# Patient Record
Sex: Male | Born: 1950 | Race: White | Hispanic: No | Marital: Married | Smoking: Never smoker
Health system: Southern US, Community
[De-identification: ages and names within clinical notes are randomized; demographics above are authoritative.]

## PROBLEM LIST (undated history)

## (undated) DIAGNOSIS — R7989 Other specified abnormal findings of blood chemistry: Secondary | ICD-10-CM

## (undated) DIAGNOSIS — E291 Testicular hypofunction: Secondary | ICD-10-CM

## (undated) DIAGNOSIS — Z0001 Encounter for general adult medical examination with abnormal findings: Secondary | ICD-10-CM

## (undated) DIAGNOSIS — H33021 Retinal detachment with multiple breaks, right eye: Secondary | ICD-10-CM

## (undated) DIAGNOSIS — I2510 Atherosclerotic heart disease of native coronary artery without angina pectoris: Secondary | ICD-10-CM

## (undated) HISTORY — PX: BACK SURGERY: SHX140

## (undated) HISTORY — PX: RETINAL DETACHMENT SURGERY: SHX105

---
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
## 2008-08-15 ENCOUNTER — Encounter: Admission: RE | Admit: 2008-08-15 | Discharge: 2008-08-15 | Payer: Self-pay | Admitting: Family Medicine

## 2015-06-11 DIAGNOSIS — I491 Atrial premature depolarization: Secondary | ICD-10-CM | POA: Diagnosis not present

## 2015-06-11 DIAGNOSIS — R079 Chest pain, unspecified: Secondary | ICD-10-CM | POA: Diagnosis not present

## 2015-06-11 DIAGNOSIS — Z6830 Body mass index (BMI) 30.0-30.9, adult: Secondary | ICD-10-CM | POA: Diagnosis not present

## 2015-06-11 DIAGNOSIS — R001 Bradycardia, unspecified: Secondary | ICD-10-CM | POA: Diagnosis not present

## 2015-06-15 DIAGNOSIS — R079 Chest pain, unspecified: Secondary | ICD-10-CM | POA: Diagnosis not present

## 2015-06-23 DIAGNOSIS — R079 Chest pain, unspecified: Secondary | ICD-10-CM | POA: Diagnosis not present

## 2015-06-24 DIAGNOSIS — R079 Chest pain, unspecified: Secondary | ICD-10-CM | POA: Diagnosis not present

## 2015-07-06 ENCOUNTER — Encounter: Payer: Self-pay | Admitting: Cardiology

## 2015-07-06 ENCOUNTER — Encounter: Payer: Self-pay | Admitting: *Deleted

## 2015-07-06 NOTE — Progress Notes (Deleted)
   PCP: No primary care provider on file.  Clinic Note: No chief complaint on file.   HPI: Jeffrey Poole is a 65 y.o. male with a PMH below who presents today for ***.  Jeffrey Poole was last seen on ***  Recent Hospitalizations: ***  Studies Reviewed: ***  Interval History: ***   No chest pain or shortness of breath with rest or exertion. No PND, orthopnea or edema. No palpitations, lightheadedness, dizziness, weakness or syncope/near syncope. No TIA/amaurosis fugax symptoms. No melena, hematochezia, hematuria, or epstaxis. No claudication.  ROS: A comprehensive was performed. ROS   No past medical history on file.  No past surgical history on file.  @hmed @  @all @  Social History   Social History  . Marital Status: Single    Spouse Name: N/A  . Number of Children: N/A  . Years of Education: N/A   Social History Main Topics  . Smoking status: Not on file  . Smokeless tobacco: Not on file  . Alcohol Use: Not on file  . Drug Use: Not on file  . Sexual Activity: Not on file   Other Topics Concern  . Not on file   Social History Narrative  . No narrative on file    @famh @  Wt Readings from Last 3 Encounters:  No data found for Wt    PHYSICAL EXAM There were no vitals taken for this visit. General appearance: alert, cooperative, appears stated age, no distress and *** obese Neck: no adenopathy, no carotid bruit and no JVD Lungs: clear to auscultation bilaterally, normal percussion bilaterally and non-labored Heart: regular rate and rhythm, S1, S2 normal, no murmur, click, rub or gallop *** Abdomen: soft, non-tender; bowel sounds normal; no masses,  no organomegaly; *** Extremities: extremities normal, atraumatic, no cyanosis, and edema *** Pulses: 2+ and symmetric; *** Skin: {normal findings:33173} or {abnormal findings:33192} Neurologic: Mental status: Alert, oriented, thought content appropriate Cranial nerves: normal (II-XII grossly intact)    Adult ECG Report  Rate: *** ;  Rhythm: {rhythm:17366};   Narrative Interpretation: ***   Other studies Reviewed: Additional studies/ records that were reviewed today include:  Recent Labs:  ***     ASSESSMENT / PLAN: Problem List Items Addressed This Visit    None      Current medicines are reviewed at length with the patient today. (+/- concerns) *** The following changes have been made: *** Studies Ordered:   No orders of the defined types were placed in this encounter.      Jeffrey Poole, M.D., M.S. Interventional Cardiologist   Pager # [phone] Phone # [phone] [address] [place], [place] [address]

## 2015-07-07 NOTE — Progress Notes (Signed)
This encounter was created in error - please disregard.

## 2015-07-16 DIAGNOSIS — H00025 Hordeolum internum left lower eyelid: Secondary | ICD-10-CM | POA: Diagnosis not present

## 2015-10-12 DIAGNOSIS — M722 Plantar fascial fibromatosis: Secondary | ICD-10-CM | POA: Diagnosis not present

## 2015-10-12 DIAGNOSIS — M6702 Short Achilles tendon (acquired), left ankle: Secondary | ICD-10-CM | POA: Diagnosis not present

## 2016-02-21 DIAGNOSIS — Z6829 Body mass index (BMI) 29.0-29.9, adult: Secondary | ICD-10-CM | POA: Diagnosis not present

## 2016-02-21 DIAGNOSIS — J329 Chronic sinusitis, unspecified: Secondary | ICD-10-CM | POA: Diagnosis not present

## 2016-02-21 DIAGNOSIS — I10 Essential (primary) hypertension: Secondary | ICD-10-CM | POA: Diagnosis not present

## 2016-06-08 DIAGNOSIS — M722 Plantar fascial fibromatosis: Secondary | ICD-10-CM | POA: Diagnosis not present

## 2016-06-08 DIAGNOSIS — M6702 Short Achilles tendon (acquired), left ankle: Secondary | ICD-10-CM | POA: Diagnosis not present

## 2016-06-08 DIAGNOSIS — I872 Venous insufficiency (chronic) (peripheral): Secondary | ICD-10-CM | POA: Diagnosis not present

## 2016-10-05 DIAGNOSIS — H179 Unspecified corneal scar and opacity: Secondary | ICD-10-CM | POA: Diagnosis not present

## 2016-10-05 DIAGNOSIS — H25812 Combined forms of age-related cataract, left eye: Secondary | ICD-10-CM | POA: Diagnosis not present

## 2016-10-05 DIAGNOSIS — H5212 Myopia, left eye: Secondary | ICD-10-CM | POA: Diagnosis not present

## 2016-10-05 DIAGNOSIS — Z961 Presence of intraocular lens: Secondary | ICD-10-CM | POA: Diagnosis not present

## 2016-10-05 DIAGNOSIS — Z9841 Cataract extraction status, right eye: Secondary | ICD-10-CM | POA: Diagnosis not present

## 2016-10-05 DIAGNOSIS — H52222 Regular astigmatism, left eye: Secondary | ICD-10-CM | POA: Diagnosis not present

## 2016-10-05 DIAGNOSIS — H524 Presbyopia: Secondary | ICD-10-CM | POA: Diagnosis not present

## 2016-12-04 DIAGNOSIS — Z23 Encounter for immunization: Secondary | ICD-10-CM | POA: Diagnosis not present

## 2017-05-03 ENCOUNTER — Ambulatory Visit: Payer: Self-pay | Admitting: Family Medicine

## 2017-05-07 ENCOUNTER — Ambulatory Visit (INDEPENDENT_AMBULATORY_CARE_PROVIDER_SITE_OTHER): Payer: Medicare Other | Admitting: Family Medicine

## 2017-05-07 ENCOUNTER — Encounter: Payer: Self-pay | Admitting: Family Medicine

## 2017-05-07 VITALS — BP 136/80 | HR 57 | Ht 75.0 in | Wt 249.1 lb

## 2017-05-07 DIAGNOSIS — Z1159 Encounter for screening for other viral diseases: Secondary | ICD-10-CM | POA: Diagnosis not present

## 2017-05-07 DIAGNOSIS — M65332 Trigger finger, left middle finger: Secondary | ICD-10-CM | POA: Diagnosis not present

## 2017-05-07 DIAGNOSIS — M65351 Trigger finger, right little finger: Secondary | ICD-10-CM | POA: Diagnosis not present

## 2017-05-07 DIAGNOSIS — R5383 Other fatigue: Secondary | ICD-10-CM | POA: Diagnosis not present

## 2017-05-07 DIAGNOSIS — M65312 Trigger thumb, left thumb: Secondary | ICD-10-CM | POA: Diagnosis not present

## 2017-05-07 DIAGNOSIS — M65311 Trigger thumb, right thumb: Secondary | ICD-10-CM

## 2017-05-07 DIAGNOSIS — M65322 Trigger finger, left index finger: Secondary | ICD-10-CM | POA: Diagnosis not present

## 2017-05-07 DIAGNOSIS — M65331 Trigger finger, right middle finger: Secondary | ICD-10-CM | POA: Diagnosis not present

## 2017-05-07 DIAGNOSIS — M65321 Trigger finger, right index finger: Secondary | ICD-10-CM | POA: Diagnosis not present

## 2017-05-07 DIAGNOSIS — M65342 Trigger finger, left ring finger: Secondary | ICD-10-CM | POA: Diagnosis not present

## 2017-05-07 DIAGNOSIS — M65352 Trigger finger, left little finger: Secondary | ICD-10-CM

## 2017-05-07 DIAGNOSIS — E291 Testicular hypofunction: Secondary | ICD-10-CM | POA: Insufficient documentation

## 2017-05-07 DIAGNOSIS — K219 Gastro-esophageal reflux disease without esophagitis: Secondary | ICD-10-CM

## 2017-05-07 DIAGNOSIS — M65341 Trigger finger, right ring finger: Secondary | ICD-10-CM | POA: Diagnosis not present

## 2017-05-07 DIAGNOSIS — Z0001 Encounter for general adult medical examination with abnormal findings: Secondary | ICD-10-CM | POA: Diagnosis not present

## 2017-05-07 DIAGNOSIS — Z0000 Encounter for general adult medical examination without abnormal findings: Secondary | ICD-10-CM | POA: Insufficient documentation

## 2017-05-07 HISTORY — DX: Encounter for general adult medical examination with abnormal findings: Z00.01

## 2017-05-07 HISTORY — DX: Gastro-esophageal reflux disease without esophagitis: K21.9

## 2017-05-07 HISTORY — DX: Testicular hypofunction: E29.1

## 2017-05-07 HISTORY — DX: Trigger thumb, left thumb: M65.311

## 2017-05-07 MED ORDER — LANSOPRAZOLE 30 MG PO CPDR: 30.0000 mg | DELAYED_RELEASE_CAPSULE | Freq: Every day | ORAL | 1 refills | Status: DC

## 2017-05-07 NOTE — Progress Notes (Signed)
Subjective:  Patient ID: Jeffrey Poole, male    DOB: 1950/02/06  Age: 67 y.o. MRN: [id number]  CC: Establish Care   HPI AQUAN KOPE presents for establishment of care for physical exam and has multiple issues to discuss.  He requests that his hepatitis C antigen be checked.  It is never been checked for him.  He describes waking up in the morning with fingers on either hand being stuck in a flexed position.  He actually has to run warm water water over them massage before he can manually straighten his fingers.  He did not do manual labor for work but did do some repetitive motion in his job.  His blood pressure runs from the 1 teens up to 150 over the 70-80 range.  His wife urges him to take 1 of her 3 blood pressure medicines but that she is concerned about his pressures.  He does not have a history of chronic kidney disease.  He has a past medical history of androgen deficiency treated with exogenous testosterone.  He does complain of fatigue and lack of energy.  He had a normal colonoscopy 2 years ago.  He has a long-standing history of GERD that has been successfully treated with a proton pump inhibitor in the past.  He does not smoke, drink alcohol or use illicit drugs.  He is not currently exercising on a regular basis.  He is retired and lives with his wife.  History Luisenrique has no past medical history on file.   He has no past surgical history on file.   His family history is not on file.He reports that he has never smoked. He has never used smokeless tobacco. His alcohol and drug histories are not on file.  No outpatient medications prior to visit.   No facility-administered medications prior to visit.     ROS Review of Systems  Constitutional: Positive for fatigue. Negative for chills, fever and unexpected weight change.  HENT: Negative.   Eyes: Negative for photophobia and visual disturbance.  Respiratory: Negative for chest tightness and shortness of breath.   Cardiovascular:  Negative for chest pain.  Gastrointestinal: Negative.   Endocrine: Negative for polyphagia and polyuria.  Genitourinary: Negative for difficulty urinating, frequency, hematuria and urgency.  Musculoskeletal: Positive for arthralgias.  Skin: Negative.   Allergic/Immunologic: Negative for immunocompromised state.  Neurological: Negative for weakness and headaches.  Hematological: Does not bruise/bleed easily.  Psychiatric/Behavioral: Negative.     Objective:  BP 136/80 (BP Location: Right Arm, Patient Position: Sitting, Cuff Size: Normal)   Pulse (!) 57   Ht 6\' 3"  (1.905 m)   Wt 249 lb 2 oz (113 kg)   SpO2 96%   BMI 31.14 kg/m   Physical Exam  Constitutional: He is oriented to person, place, and time. He appears well-developed and well-nourished. No distress.  HENT:  Head: Normocephalic and atraumatic.  Right Ear: External ear normal.  Left Ear: External ear normal.  Nose: Nose normal.  Mouth/Throat: Oropharynx is clear and moist. No oropharyngeal exudate.  Eyes: Pupils are equal, round, and reactive to light. Conjunctivae and EOM are normal. Right eye exhibits no discharge. Left eye exhibits no discharge. No scleral icterus.  Neck: Normal range of motion. Neck supple. No JVD present. No tracheal deviation present. No thyromegaly present.  Cardiovascular: Normal rate, regular rhythm and normal heart sounds.  Pulmonary/Chest: Effort normal and breath sounds normal.  Abdominal: Soft. Bowel sounds are normal. He exhibits no distension. There is no tenderness.  There is no guarding.  Genitourinary: Rectal exam shows no external hemorrhoid, no internal hemorrhoid, no fissure, no mass, no tenderness, anal tone normal and guaiac negative stool. Prostate is not enlarged and not tender.  Lymphadenopathy:    He has no cervical adenopathy.  Neurological: He is alert and oriented to person, place, and time.  Skin: Skin is warm and dry. No rash noted. He is not diaphoretic. No erythema. No  pallor.  Psychiatric: He has a normal mood and affect. His behavior is normal.      Assessment & Plan:   Bunny was seen today for establish care.  Diagnoses and all orders for this visit:  Trigger finger of all digits of both hands -     Ambulatory referral to Sports Medicine  Gastroesophageal reflux disease, esophagitis presence not specified -     lansoprazole (PREVACID) 30 MG capsule; Take 1 capsule (30 mg total) by mouth daily.  Encounter for hepatitis C screening test for low risk patient -     Hepatitis C antibody  Encounter for health maintenance examination with abnormal findings -     CBC -     Comprehensive metabolic panel -     Lipid panel -     PSA  Androgen deficiency -     Testosterone  Fatigue, unspecified type -     CBC -     Urinalysis, Routine w reflex microscopic -     TSH   I am having Taurus M. Sponaugle start on lansoprazole.  Meds ordered this encounter  Medications  . lansoprazole (PREVACID) 30 MG capsule    Sig: Take 1 capsule (30 mg total) by mouth daily.    Dispense:  100 capsule    Refill:  1   Explained that systolic pressure of less than 150 is considered normal for his age group without a history of chronic kidney disease.  Advised him to lose weight and lower the salt in his diet.  He was given information on the DASH diet.  He was encouraged to lose weight and become more physically active with nonweightbearing aerobic activity.  He  said that he had jogged in the past and I suggested that he use an elliptical machine instead.  He was given information on preventative health and health maintenance.  We will try Prevacid for his GERD symptoms.   Follow-up: Return in about 3 months (around 08/06/2017).  Libby Maw, MD

## 2017-05-07 NOTE — Patient Instructions (Signed)
Fatigue Fatigue is feeling tired all of the time, a lack of energy, or a lack of motivation. Occasional or mild fatigue is often a normal response to activity or life in general. However, long-lasting (chronic) or extreme fatigue may indicate an underlying medical condition. Follow these instructions at home: Watch your fatigue for any changes. The following actions may help to lessen any discomfort you are feeling:  Talk to your health care provider about how much sleep you need each night. Try to get the required amount every night.  Take medicines only as directed by your health care provider.  Eat a healthy and nutritious diet. Ask your health care provider if you need help changing your diet.  Drink enough fluid to keep your urine clear or pale yellow.  Practice ways of relaxing, such as yoga, meditation, massage therapy, or acupuncture.  Exercise regularly.  Change situations that cause you stress. Try to keep your work and personal routine reasonable.  Do not abuse illegal drugs.  Limit alcohol intake to no more than 1 drink per day for nonpregnant women and 2 drinks per day for men. One drink equals 12 ounces of beer, 5 ounces of wine, or 1 ounces of hard liquor.  Take a multivitamin, if directed by your health care provider.  Contact a health care provider if:  Your fatigue does not get better.  You have a fever.  You have unintentional weight loss or gain.  You have headaches.  You have difficulty: ? Falling asleep. ? Sleeping throughout the night.  You feel angry, guilty, anxious, or sad.  You are unable to have a bowel movement (constipation).  You skin is dry.  Your legs or another part of your body is swollen. Get help right away if:  You feel confused.  Your vision is blurry.  You feel faint or pass out.  You have a severe headache.  You have severe abdominal, pelvic, or back pain.  You have chest pain, shortness of breath, or an irregular or  fast heartbeat.  You are unable to urinate or you urinate less than normal.  You develop abnormal bleeding, such as bleeding from the rectum, vagina, nose, lungs, or nipples.  You vomit blood.  You have thoughts about harming yourself or committing suicide.  You are worried that you might harm someone else. This information is not intended to replace advice given to you by your health care provider. Make sure you discuss any questions you have with your health care provider. Document Released: 10/30/2006 Document Revised: 06/10/2015 Document Reviewed: 05/06/2013 Elsevier Interactive Patient Education  2018 [place].  Gastroesophageal Reflux Disease, Adult Normally, food travels down the esophagus and stays in the stomach to be digested. If a person has gastroesophageal reflux disease (GERD), food and stomach acid move back up into the esophagus. When this happens, the esophagus becomes sore and swollen (inflamed). Over time, GERD can make small holes (ulcers) in the lining of the esophagus. Follow these instructions at home: Diet  Follow a diet as told by your doctor. You may need to avoid foods and drinks such as: ? Coffee and tea (with or without caffeine). ? Drinks that contain alcohol. ? Energy drinks and sports drinks. ? Carbonated drinks or sodas. ? Chocolate and cocoa. ? Peppermint and mint flavorings. ? Garlic and onions. ? Horseradish. ? Spicy and acidic foods, such as peppers, chili powder, curry powder, vinegar, hot sauces, and BBQ sauce. ? Citrus fruit juices and citrus fruits, such as oranges,  lemons, and limes. ? Tomato-based foods, such as red sauce, chili, salsa, and pizza with red sauce. ? Fried and fatty foods, such as donuts, french fries, potato chips, and high-fat dressings. ? High-fat meats, such as hot dogs, rib eye steak, sausage, ham, and bacon. ? High-fat dairy items, such as whole milk, butter, and cream cheese.  Eat small meals often. Avoid eating  large meals.  Avoid drinking large amounts of liquid with your meals.  Avoid eating meals during the 2-3 hours before bedtime.  Avoid lying down right after you eat.  Do not exercise right after you eat. General instructions  Pay attention to any changes in your symptoms.  Take over-the-counter and prescription medicines only as told by your doctor. Do not take aspirin, ibuprofen, or other NSAIDs unless your doctor says it is okay.  Do not use any tobacco products, including cigarettes, chewing tobacco, and e-cigarettes. If you need help quitting, ask your doctor.  Wear loose clothes. Do not wear anything tight around your waist.  Raise (elevate) the head of your bed about 6 inches (15 cm).  Try to lower your stress. If you need help doing this, ask your doctor.  If you are overweight, lose an amount of weight that is healthy for you. Ask your doctor about a safe weight loss goal.  Keep all follow-up visits as told by your doctor. This is important. Contact a doctor if:  You have new symptoms.  You lose weight and you do not know why it is happening.  You have trouble swallowing, or it hurts to swallow.  You have wheezing or a cough that keeps happening.  Your symptoms do not get better with treatment.  You have a hoarse voice. Get help right away if:  You have pain in your arms, neck, jaw, teeth, or back.  You feel sweaty, dizzy, or light-headed.  You have chest pain or shortness of breath.  You throw up (vomit) and your throw up looks like blood or coffee grounds.  You pass out (faint).  Your poop (stool) is bloody or black.  You cannot swallow, drink, or eat. This information is not intended to replace advice given to you by your health care provider. Make sure you discuss any questions you have with your health care provider. Document Released: 06/21/2007 Document Revised: 06/10/2015 Document Reviewed: 04/29/2014 Elsevier Interactive Patient Education   2018 [place] Maintenance, Male A healthy lifestyle and preventive care is important for your health and wellness. Ask your health care provider about what schedule of regular examinations is right for you. What should I know about weight and diet? Eat a Healthy Diet  Eat plenty of vegetables, fruits, whole grains, low-fat dairy products, and lean protein.  Do not eat a lot of foods high in solid fats, added sugars, or salt.  Maintain a Healthy Weight Regular exercise can help you achieve or maintain a healthy weight. You should:  Do at least 150 minutes of exercise each week. The exercise should increase your heart rate and make you sweat (moderate-intensity exercise).  Do strength-training exercises at least twice a week.  Watch Your Levels of Cholesterol and Blood Lipids  Have your blood tested for lipids and cholesterol every 5 years starting at 67 years of age. If you are at high risk for heart disease, you should start having your blood tested when you are 67 years old. You may need to have your cholesterol levels checked more often if: ? Your lipid  or cholesterol levels are high. ? You are older than 67 years of age. ? You are at high risk for heart disease.  What should I know about cancer screening? Many types of cancers can be detected early and may often be prevented. Lung Cancer  You should be screened every year for lung cancer if: ? You are a current smoker who has smoked for at least 30 years. ? You are a former smoker who has quit within the past 15 years.  Talk to your health care provider about your screening options, when you should start screening, and how often you should be screened.  Colorectal Cancer  Routine colorectal cancer screening usually begins at 67 years of age and should be repeated every 5-10 years until you are 67 years old. You may need to be screened more often if early forms of precancerous polyps or small growths are found.  Your health care provider may recommend screening at an earlier age if you have risk factors for colon cancer.  Your health care provider may recommend using home test kits to check for hidden blood in the stool.  A small camera at the end of a tube can be used to examine your colon (sigmoidoscopy or colonoscopy). This checks for the earliest forms of colorectal cancer.  Prostate and Testicular Cancer  Depending on your age and overall health, your health care provider may do certain tests to screen for prostate and testicular cancer.  Talk to your health care provider about any symptoms or concerns you have about testicular or prostate cancer.  Skin Cancer  Check your skin from head to toe regularly.  Tell your health care provider about any new moles or changes in moles, especially if: ? There is a change in a mole's size, shape, or color. ? You have a mole that is larger than a pencil eraser.  Always use sunscreen. Apply sunscreen liberally and repeat throughout the day.  Protect yourself by wearing long sleeves, pants, a wide-brimmed hat, and sunglasses when outside.  What should I know about heart disease, diabetes, and high blood pressure?  If you are 49-73 years of age, have your blood pressure checked every 3-5 years. If you are 16 years of age or older, have your blood pressure checked every year. You should have your blood pressure measured twice-once when you are at a hospital or clinic, and once when you are not at a hospital or clinic. Record the average of the two measurements. To check your blood pressure when you are not at a hospital or clinic, you can use: ? An automated blood pressure machine at a pharmacy. ? A home blood pressure monitor.  Talk to your health care provider about your target blood pressure.  If you are between 43-71 years old, ask your health care provider if you should take aspirin to prevent heart disease.  Have regular diabetes screenings by  checking your fasting blood sugar level. ? If you are at a normal weight and have a low risk for diabetes, have this test once every three years after the age of 73. ? If you are overweight and have a high risk for diabetes, consider being tested at a younger age or more often.  A one-time screening for abdominal aortic aneurysm (AAA) by ultrasound is recommended for men aged 55-75 years who are current or former smokers. What should I know about preventing infection? Hepatitis B If you have a higher risk for hepatitis B, you should  be screened for this virus. Talk with your health care provider to find out if you are at risk for hepatitis B infection. Hepatitis C Blood testing is recommended for:  Everyone born from [birth year] through 1965.  Anyone with known risk factors for hepatitis C.  Sexually Transmitted Diseases (STDs)  You should be screened each year for STDs including gonorrhea and chlamydia if: ? You are sexually active and are younger than 67 years of age. ? You are older than 67 years of age and your health care provider tells you that you are at risk for this type of infection. ? Your sexual activity has changed since you were last screened and you are at an increased risk for chlamydia or gonorrhea. Ask your health care provider if you are at risk.  Talk with your health care provider about whether you are at high risk of being infected with HIV. Your health care provider may recommend a prescription medicine to help prevent HIV infection.  What else can I do?  Schedule regular health, dental, and eye exams.  Stay current with your vaccines (immunizations).  Do not use any tobacco products, such as cigarettes, chewing tobacco, and e-cigarettes. If you need help quitting, ask your health care provider.  Limit alcohol intake to no more than 2 drinks per day. One drink equals 12 ounces of beer, 5 ounces of wine, or 1 ounces of hard liquor.  Do not use street drugs.  Do not  share needles.  Ask your health care provider for help if you need support or information about quitting drugs.  Tell your health care provider if you often feel depressed.  Tell your health care provider if you have ever been abused or do not feel safe at home. This information is not intended to replace advice given to you by your health care provider. Make sure you discuss any questions you have with your health care provider. Document Released: 07/01/2007 Document Revised: 09/01/2015 Document Reviewed: 10/06/2014 Elsevier Interactive Patient Education  2018 [employer]. Hepatitis C Test Hepatitis C is a liver infection caused by the hepatitis C virus (HCV). Hepatitis C is usually diagnosed with two blood tests. One test checks for antibodies to the virus in your blood. Antibodies are proteins that your body makes to fight infections. If you have antibodies to HCV, it means you have been infected. It does not mean you are still infected. An HCV infection may not cause any symptoms, and you may be able to get rid of the virus without treatment. If you have antibodies to HCV, you will need to have another test to find out if you are still infected. This test is called the HCV RNA qualitative test. It looks for genetic material from HCV in your blood. If you are diagnosed with an active HCV infection, you may also have an HCV RNA quantitative test to measure the amount of virus in your blood (viral load). Your health care provider may repeat this test in order to monitor you during treatment. You may also have an HCV genotype test to identify the kind (genotype) of virus you have. This helps your health care provider determine the best treatment for you. You may be tested if you show symptoms of HCV infection. It is important to be tested because hepatitis C can lead to serious liver damage if not treated. All HCV tests require a blood sample taken from a vein in your hand or arm. What do the  results mean? It  is your responsibility to obtain your test results. Ask the lab or department performing the test when and how you will get your results. Talk to your health care provider if you have any questions about your test results. Results of both the HCV antibody test and the HCV RNA test will be either positive or negative. Meaning of Negative Test Results  If your HCV antibody test is negative, it may mean that you have not been infected with HCV. However, it can take a few months for the antibodies to build up in your blood. If it is possible you may have been infected recently, you may need to repeat the test.  If your HCV RNA qualitative test is negative, this means it is unlikely you have an active HCV infection. Meaning of Positive Test Results  If your HCV antibody test is positive, it is likely that you are infected or have been infected with HCV.  If your HCV RNA qualitative test is also positive, it confirms you have an active HCV infection. Talk with your health care provider to discuss your results, treatment options, and if necessary, the need for more tests. Talk with your health care provider if you have any questions about your results. This information is not intended to replace advice given to you by your health care provider. Make sure you discuss any questions you have with your health care provider. Document Released: 02/05/2004 Document Revised: 09/08/2015 Document Reviewed: 04/07/2013 Elsevier Interactive Patient Education  2018 [place] 65 Years and Older, Male Preventive care refers to lifestyle choices and visits with your health care provider that can promote health and wellness. What does preventive care include?  A yearly physical exam. This is also called an annual well check.  Dental exams once or twice a year.  Routine eye exams. Ask your health care provider how often you should have your eyes checked.  Personal lifestyle  choices, including: ? Daily care of your teeth and gums. ? Regular physical activity. ? Eating a healthy diet. ? Avoiding tobacco and drug use. ? Limiting alcohol use. ? Practicing safe sex. ? Taking low doses of aspirin every day. ? Taking vitamin and mineral supplements as recommended by your health care provider. What happens during an annual well check? The services and screenings done by your health care provider during your annual well check will depend on your age, overall health, lifestyle risk factors, and family history of disease. Counseling Your health care provider may ask you questions about your:  Alcohol use.  Tobacco use.  Drug use.  Emotional well-being.  Home and relationship well-being.  Sexual activity.  Eating habits.  History of falls.  Memory and ability to understand (cognition).  Work and work Statistician.  Screening You may have the following tests or measurements:  Height, weight, and BMI.  Blood pressure.  Lipid and cholesterol levels. These may be checked every 5 years, or more frequently if you are over 16 years old.  Skin check.  Lung cancer screening. You may have this screening every year starting at age 82 if you have a 30-pack-year history of smoking and currently smoke or have quit within the past 15 years.  Fecal occult blood test (FOBT) of the stool. You may have this test every year starting at age 70.  Flexible sigmoidoscopy or colonoscopy. You may have a sigmoidoscopy every 5 years or a colonoscopy every 10 years starting at age 5.  Prostate cancer screening. Recommendations will vary  depending on your family history and other risks.  Hepatitis C blood test.  Hepatitis B blood test.  Sexually transmitted disease (STD) testing.  Diabetes screening. This is done by checking your blood sugar (glucose) after you have not eaten for a while (fasting). You may have this done every 1-3 years.  Abdominal aortic aneurysm  (AAA) screening. You may need this if you are a current or former smoker.  Osteoporosis. You may be screened starting at age 13 if you are at high risk.  Talk with your health care provider about your test results, treatment options, and if necessary, the need for more tests. Vaccines Your health care provider may recommend certain vaccines, such as:  Influenza vaccine. This is recommended every year.  Tetanus, diphtheria, and acellular pertussis (Tdap, Td) vaccine. You may need a Td booster every 10 years.  Varicella vaccine. You may need this if you have not been vaccinated.  Zoster vaccine. You may need this after age 60.  Measles, mumps, and rubella (MMR) vaccine. You may need at least one dose of MMR if you were born in 1957 or later. You may also need a second dose.  Pneumococcal 13-valent conjugate (PCV13) vaccine. One dose is recommended after age 59.  Pneumococcal polysaccharide (PPSV23) vaccine. One dose is recommended after age 58.  Meningococcal vaccine. You may need this if you have certain conditions.  Hepatitis A vaccine. You may need this if you have certain conditions or if you travel or work in places where you may be exposed to hepatitis A.  Hepatitis B vaccine. You may need this if you have certain conditions or if you travel or work in places where you may be exposed to hepatitis B.  Haemophilus influenzae type b (Hib) vaccine. You may need this if you have certain risk factors.  Talk to your health care provider about which screenings and vaccines you need and how often you need them. This information is not intended to replace advice given to you by your health care provider. Make sure you discuss any questions you have with your health care provider. Document Released: 01/29/2015 Document Revised: 09/22/2015 Document Reviewed: 11/03/2014 Elsevier Interactive Patient Education  2018 [place] Eating Plan DASH stands for "Dietary Approaches to Stop  Hypertension." The DASH eating plan is a healthy eating plan that has been shown to reduce high blood pressure (hypertension). It may also reduce your risk for type 2 diabetes, heart disease, and stroke. The DASH eating plan may also help with weight loss. What are tips for following this plan? General guidelines  Avoid eating more than 2,300 mg (milligrams) of salt (sodium) a day. If you have hypertension, you may need to reduce your sodium intake to 1,500 mg a day.  Limit alcohol intake to no more than 1 drink a day for nonpregnant women and 2 drinks a day for men. One drink equals 12 oz of beer, 5 oz of wine, or 1 oz of hard liquor.  Work with your health care provider to maintain a healthy body weight or to lose weight. Ask what an ideal weight is for you.  Get at least 30 minutes of exercise that causes your heart to beat faster (aerobic exercise) most days of the week. Activities may include walking, swimming, or biking.  Work with your health care provider or diet and nutrition specialist (dietitian) to adjust your eating plan to your individual calorie needs. Reading food labels  Check food labels for the amount of  sodium per serving. Choose foods with less than 5 percent of the Daily Value of sodium. Generally, foods with less than 300 mg of sodium per serving fit into this eating plan.  To find whole grains, look for the word "whole" as the first word in the ingredient list. Shopping  Buy products labeled as "low-sodium" or "no salt added."  Buy fresh foods. Avoid canned foods and premade or frozen meals. Cooking  Avoid adding salt when cooking. Use salt-free seasonings or herbs instead of table salt or sea salt. Check with your health care provider or pharmacist before using salt substitutes.  Do not fry foods. Cook foods using healthy methods such as baking, boiling, grilling, and broiling instead.  Cook with heart-healthy oils, such as olive, canola, soybean, or sunflower  oil. Meal planning   Eat a balanced diet that includes: ? 5 or more servings of fruits and vegetables each day. At each meal, try to fill half of your plate with fruits and vegetables. ? Up to 6-8 servings of whole grains each day. ? Less than 6 oz of lean meat, poultry, or fish each day. A 3-oz serving of meat is about the same size as a deck of cards. One egg equals 1 oz. ? 2 servings of low-fat dairy each day. ? A serving of nuts, seeds, or beans 5 times each week. ? Heart-healthy fats. Healthy fats called Omega-3 fatty acids are found in foods such as flaxseeds and coldwater fish, like sardines, salmon, and mackerel.  Limit how much you eat of the following: ? Canned or prepackaged foods. ? Food that is high in trans fat, such as fried foods. ? Food that is high in saturated fat, such as fatty meat. ? Sweets, desserts, sugary drinks, and other foods with added sugar. ? Full-fat dairy products.  Do not salt foods before eating.  Try to eat at least 2 vegetarian meals each week.  Eat more home-cooked food and less restaurant, buffet, and fast food.  When eating at a restaurant, ask that your food be prepared with less salt or no salt, if possible. What foods are recommended? The items listed may not be a complete list. Talk with your dietitian about what dietary choices are best for you. Grains Whole-grain or whole-wheat bread. Whole-grain or whole-wheat pasta. Brown rice. [place]. Bulgur. Whole-grain and low-sodium cereals. Pita bread. Low-fat, low-sodium crackers. Whole-wheat flour tortillas. Vegetables Fresh or frozen vegetables (raw, steamed, roasted, or grilled). Low-sodium or reduced-sodium tomato and vegetable juice. Low-sodium or reduced-sodium tomato sauce and tomato paste. Low-sodium or reduced-sodium canned vegetables. Fruits All fresh, dried, or frozen fruit. Canned fruit in natural juice (without added sugar). Meat and other protein foods Skinless chicken or  [place]. Ground chicken or [place]. Pork with fat trimmed off. Fish and seafood. Egg whites. Dried beans, peas, or lentils. Unsalted nuts, nut butters, and seeds. Unsalted canned beans. Lean cuts of beef with fat trimmed off. Low-sodium, lean deli meat. Dairy Low-fat (1%) or fat-free (skim) milk. Fat-free, low-fat, or reduced-fat cheeses. Nonfat, low-sodium ricotta or cottage cheese. Low-fat or nonfat yogurt. Low-fat, low-sodium cheese. Fats and oils Soft margarine without trans fats. Vegetable oil. Low-fat, reduced-fat, or light mayonnaise and salad dressings (reduced-sodium). Canola, safflower, olive, soybean, and sunflower oils. Avocado. Seasoning and other foods Herbs. Spices. Seasoning mixes without salt. Unsalted popcorn and pretzels. Fat-free sweets. What foods are not recommended? The items listed may not be a complete list. Talk with your dietitian about what dietary choices are best for  you. Grains Baked goods made with fat, such as croissants, muffins, or some breads. Dry pasta or rice meal packs. Vegetables Creamed or fried vegetables. Vegetables in a cheese sauce. Regular canned vegetables (not low-sodium or reduced-sodium). Regular canned tomato sauce and paste (not low-sodium or reduced-sodium). Regular tomato and vegetable juice (not low-sodium or reduced-sodium). Angie Fava. Olives. Fruits Canned fruit in a light or heavy syrup. Fried fruit. Fruit in cream or butter sauce. Meat and other protein foods Fatty cuts of meat. Ribs. Fried meat. Berniece Salines. Sausage. Bologna and other processed lunch meats. Salami. Fatback. Hotdogs. Bratwurst. Salted nuts and seeds. Canned beans with added salt. Canned or smoked fish. Whole eggs or egg yolks. Chicken or [place] with skin. Dairy Whole or 2% milk, cream, and half-and-half. Whole or full-fat cream cheese. Whole-fat or sweetened yogurt. Full-fat cheese. Nondairy creamers. Whipped toppings. Processed cheese and cheese spreads. Fats and oils Butter.  Stick margarine. Lard. Shortening. Ghee. Bacon fat. Tropical oils, such as coconut, palm kernel, or palm oil. Seasoning and other foods Salted popcorn and pretzels. Onion salt, garlic salt, seasoned salt, table salt, and sea salt. Worcestershire sauce. Tartar sauce. Barbecue sauce. Teriyaki sauce. Soy sauce, including reduced-sodium. Steak sauce. Canned and packaged gravies. Fish sauce. Oyster sauce. Cocktail sauce. Horseradish that you find on the shelf. Ketchup. Mustard. Meat flavorings and tenderizers. Bouillon cubes. Hot sauce and Tabasco sauce. Premade or packaged marinades. Premade or packaged taco seasonings. Relishes. Regular salad dressings. Where to find more information:  National Heart, Lung, and [place]: https://wilson-eaton.com/  American Heart Association: www.heart.org Summary  The DASH eating plan is a healthy eating plan that has been shown to reduce high blood pressure (hypertension). It may also reduce your risk for type 2 diabetes, heart disease, and stroke.  With the DASH eating plan, you should limit salt (sodium) intake to 2,300 mg a day. If you have hypertension, you may need to reduce your sodium intake to 1,500 mg a day.  When on the DASH eating plan, aim to eat more fresh fruits and vegetables, whole grains, lean proteins, low-fat dairy, and heart-healthy fats.  Work with your health care provider or diet and nutrition specialist (dietitian) to adjust your eating plan to your individual calorie needs. This information is not intended to replace advice given to you by your health care provider. Make sure you discuss any questions you have with your health care provider. Document Released: 12/22/2010 Document Revised: 12/27/2015 Document Reviewed: 12/27/2015 Elsevier Interactive Patient Education  [employer].

## 2017-05-08 LAB — COMPREHENSIVE METABOLIC PANEL
ALBUMIN: 4.2 g/dL (ref 3.5–5.2)
ALT: 60 U/L — ABNORMAL HIGH (ref 0–53)
AST: 36 U/L (ref 0–37)
Alkaline Phosphatase: 83 U/L (ref 39–117)
BUN: 12 mg/dL (ref 6–23)
CO2: 27 meq/L (ref 19–32)
CREATININE: 0.93 mg/dL (ref 0.40–1.50)
Calcium: 9.6 mg/dL (ref 8.4–10.5)
Chloride: 105 mEq/L (ref 96–112)
GFR: 86.15 mL/min (ref >60.00)
GLUCOSE: 99 mg/dL (ref 70–99)
POTASSIUM: 4.2 meq/L (ref 3.5–5.1)
SODIUM: 139 meq/L (ref 135–145)
Total Bilirubin: 0.7 mg/dL (ref 0.2–1.2)
Total Protein: 7.2 g/dL (ref 6.0–8.3)

## 2017-05-08 LAB — URINALYSIS, ROUTINE W REFLEX MICROSCOPIC
Bilirubin Urine: NEGATIVE
Hgb urine dipstick: NEGATIVE
Ketones, ur: NEGATIVE
Leukocytes, UA: NEGATIVE
Nitrite: NEGATIVE
PH: 7.5 (ref 5.0–8.0)
Specific Gravity, Urine: 1.015 (ref 1.000–1.030)
TOTAL PROTEIN, URINE-UPE24: NEGATIVE
UROBILINOGEN UA: 1 (ref 0.0–1.0)
Urine Glucose: NEGATIVE
WBC UA: NONE SEEN (ref 0–?)

## 2017-05-08 LAB — LIPID PANEL
CHOL/HDL RATIO: 4
Cholesterol: 165 mg/dL (ref 0–200)
HDL: 46.9 mg/dL (ref >39.00)
LDL CALC: 92 mg/dL (ref 0–99)
NONHDL: 117.84
TRIGLYCERIDES: 130 mg/dL (ref 0.0–149.0)
VLDL: 26 mg/dL (ref 0.0–40.0)

## 2017-05-08 LAB — TESTOSTERONE: TESTOSTERONE: 280.62 ng/dL — AB (ref 300.00–890.00)

## 2017-05-08 LAB — HEPATITIS C ANTIBODY
Hepatitis C Ab: NONREACTIVE
SIGNAL TO CUT-OFF: 0.05 (ref <1.00)

## 2017-05-08 LAB — PSA: PSA: 1.7 ng/mL (ref 0.10–4.00)

## 2017-05-08 LAB — TSH: TSH: 1.29 u[IU]/mL (ref 0.35–4.50)

## 2017-05-11 ENCOUNTER — Telehealth: Payer: Self-pay

## 2017-05-11 NOTE — Telephone Encounter (Signed)
Patient was contact for a redraw for CBC labs. Patient was told that sample arrived clotted to [facility] for testing and therefore would need to be redone. Patient did not state when they would return for the redraw, but is aware of the situation.

## 2017-05-15 ENCOUNTER — Other Ambulatory Visit (INDEPENDENT_AMBULATORY_CARE_PROVIDER_SITE_OTHER): Payer: Medicare Other

## 2017-05-15 DIAGNOSIS — Z0001 Encounter for general adult medical examination with abnormal findings: Secondary | ICD-10-CM

## 2017-05-15 DIAGNOSIS — R5383 Other fatigue: Secondary | ICD-10-CM

## 2017-05-15 NOTE — Progress Notes (Signed)
Pt came in for CBC redraw. Lab was notified on 4/23 by [facility] that the sample was clotted and unable to test.

## 2017-05-16 LAB — CBC
HEMATOCRIT: 45.1 % (ref 39.0–52.0)
HEMOGLOBIN: 15.2 g/dL (ref 13.0–17.0)
MCHC: 33.6 g/dL (ref 30.0–36.0)
MCV: 83.7 fl (ref 78.0–100.0)
PLATELETS: 244 10*3/uL (ref 150.0–400.0)
RBC: 5.39 Mil/uL (ref 4.22–5.81)
RDW: 14.7 % (ref 11.5–15.5)
WBC: 5.8 10*3/uL (ref 4.0–10.5)

## 2017-05-21 ENCOUNTER — Encounter: Payer: Self-pay | Admitting: Family Medicine

## 2017-05-21 ENCOUNTER — Ambulatory Visit (INDEPENDENT_AMBULATORY_CARE_PROVIDER_SITE_OTHER): Payer: Medicare Other | Admitting: Family Medicine

## 2017-05-21 VITALS — BP 128/80 | HR 70 | Ht 75.0 in | Wt 250.1 lb

## 2017-05-21 DIAGNOSIS — E291 Testicular hypofunction: Secondary | ICD-10-CM | POA: Diagnosis not present

## 2017-05-21 NOTE — Progress Notes (Signed)
Subjective:  Patient ID: Jeffrey Poole, male    DOB: 28-Sep-1950  Age: 67 y.o. 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  Tells me that his testosterone levels of been on the low side for the last 30 years.  ED and libido have not been a particular concern of his but he has noticed a decrease in his energy and some loss of muscle mass over the years.  He says that his energy levels have responded to androgen gel in the past.  He has been reluctant to consider testosterone cypionate because he feels like there may be a spike in his level from that.  Outpatient Medications Prior to Visit  Medication Sig Dispense Refill  . lansoprazole (PREVACID) 30 MG capsule Take 1 capsule (30 mg total) by mouth daily. 100 capsule 1   No facility-administered medications prior to visit.     ROS Review of Systems  Constitutional: Negative for chills, fatigue, fever and unexpected weight change.  Respiratory: Negative.   Cardiovascular: Negative.   Gastrointestinal: Negative.   Genitourinary: Negative.   Hematological: Negative.   Psychiatric/Behavioral: Negative.     Objective:  BP 128/80   Pulse 70   Ht 6\' 3"  (1.905 m)   Wt 250 lb 2 oz (113.5 kg)   SpO2 96%   BMI 31.26 kg/m   BP Readings from Last 3 Encounters:  05/21/17 128/80  05/07/17 136/80    Wt Readings from Last 3 Encounters:  05/21/17 250 lb 2 oz (113.5 kg)  05/07/17 249 lb 2 oz (113 kg)    Physical Exam  Constitutional: He is oriented to person, place, and time. He appears well-developed and well-nourished. No distress.  HENT:  Head: Normocephalic and atraumatic.  Right Ear: External ear normal.  Left Ear: External ear normal.  Eyes: Right eye exhibits no discharge. Left eye exhibits no discharge. No scleral icterus.  Neck: No JVD present. No tracheal deviation present.  Pulmonary/Chest: Effort normal.  Neurological: He is alert and oriented to  person, place, and time.  Skin: He is not diaphoretic.    Lab Results  Component Value Date   WBC 5.8 05/15/2017   HGB 15.2 05/15/2017   HCT 45.1 05/15/2017   PLT 244.0 05/15/2017   GLUCOSE 99 05/07/2017   CHOL 165 05/07/2017   TRIG 130.0 05/07/2017   HDL 46.90 05/07/2017   LDLCALC 92 05/07/2017   ALT 60 (H) 05/07/2017   AST 36 05/07/2017   NA 139 05/07/2017   K 4.2 05/07/2017   CL 105 05/07/2017   CREATININE 0.93 05/07/2017   BUN 12 05/07/2017   CO2 27 05/07/2017   TSH 1.29 05/07/2017   PSA 1.70 05/07/2017    Mr Cervical Spine Wo Contrast  Result Date: 08/15/2008 Clinical Data: Neck pain and numbness and tingling and left arm.  MRI CERVICAL SPINE WITHOUT CONTRAST  Technique:  Multiplanar and multiecho pulse sequences of the cervical spine, to include the craniocervical junction and cervicothoracic junction, were obtained according to standard protocol without intravenous contrast.  Comparison: Outside cervical spine radiographs  Findings: The sagittal images demonstrate normal alignment of the cervical vertebral bodies.  They demonstrate normal marrow signal. The cervical spinal cord demonstrates normal signal intensity and no Chiari malformation is seen.  C2-3:  No significant findings.  C3-4:  No significant findings.  C4-5:  No significant findings.  C5-6:  Broad-based disc protrusion along with a bulging annulus and  osteophytic ridging.  This creates mild spinal stenosis with near obliteration of the anterior CSF space.   There is also moderate foraminal stenosis bilaterally due to a combination of the diffuse disc protrusion and uncinate spurring.  C6-7:  Broad-based disc protrusion and osteophytic and uncinate spurring.  Mild spinal stenosis and mild to moderate bilateral foraminal stenosis.  C7-C1:  No significant findings.  IMPRESSION: Disc protrusions, osteophytic and uncinate spurring changes at C5-6 and C6-7 with spinal and foraminal stenosis as described above. Provider:  Lavonia Dana   Assessment & Plan:   Donna was seen today for follow-up.  Diagnoses and all orders for this visit:  Androgen deficiency   I am having Gardiner M. Lipe maintain his lansoprazole.  No orders of the defined types were placed in this encounter.  We had a discussion about reasonable expectations with testosterone replacement therapy.  He could reasonably expect an increase in his energy levels and promotion of muscle mass with weight lifting.  We discussed using AndroGel which he versus injectable testosterone replacement.  Patient is going to think about it and look into what exactly what his insurance will cover.  We also had another discussion about JNC 8 recommendations for his blood pressure.  Follow-up: Return if symptoms worsen or fail to improve.  Libby Maw, MD

## 2017-05-21 NOTE — Patient Instructions (Signed)
Testosterone Replacement Therapy Testosterone replacement therapy (TRT) is used to treat men who have a low testosterone level (hypogonadism). Testosterone is a male hormone that is produced in the testicles. It is responsible for typically male characteristics and for maintaining a man's sex drive and the ability to get an erection. Testosterone also supports bone and muscle health. TRT can be a gel, liquid, or patch that you put on your skin. It can also be in the form of a tablet or an injection. In some cases, your health care provider may insert long-acting pellets under your skin. In most men, the level of testosterone starts to decline gradually after age 45. Low testosterone can also be caused by certain medical conditions, medicines, and obesity. Your health care provider can diagnose hypogonadism with at least two blood tests that are done early in the morning. Low testosterone may not need to be treated. TRT is usually a choice that you make with your health care provider. Your health care provider may recommend TRT if you have low testosterone that is causing symptoms, such as:  Low sex drive.  Erection problems.  Breast enlargement.  Loss of body hair.  Weak muscles or bones.  Shrinking testicles.  Increased body fat.  Low energy.  Hot flashes.  Depression.  Decreased work performance.  TRT is a lifetime treatment. If you stop treatment, your testosterone will drop, and your symptoms may return. What are the risks? Testosterone replacement therapy may have side effects, including:  Lower sperm count.  Skin irritation at the application or injection site.  Mouth irritation if you take an oral tablet.  Acne.  Swelling of your legs or feet.  Tender breasts.  Dizziness.  Sleep disturbance.  Mood swings.  Possible increased risk of stroke or heart attack.  Testosterone replacement therapy may also increase your risk for prostate cancer or male breast cancer.  You should not use TRT if you have either of those conditions. Your health care provider also may not recommend TRT if:  You are suspected of having prostate cancer.  You want to father a child.  You have a high number of red blood cells.  You have untreated sleep apnea.  You have a very large prostate.  Supplies needed:  Your health care provider will prescribe the testosterone gel, solution, or medicine that you need. If your health care provider teaches you to do self-injections at home, you will also need: ? Your medicine vial. ? Disposable needles and syringes. ? Alcohol swabs. ? A needle disposal container. ? Adhesive bandages. How to use testosterone replacement therapy Your health care provider will help you find the TRT option that will work best for you based on your preference, the side effects, and the cost. You may:  Rub testosterone gel on your upper arm or shoulder every day after a shower. This is the most common type of TRT. Do not let women or children come in contact with the gel.  Apply a testosterone solution under your arms once each day.  Place a testosterone patch on your skin once each day.  Dissolve a testosterone tablet in your mouth twice each day.  Have a testosterone pellet inserted under your skin by your health care provider. This will be replaced every 3-6 months.  Use testosterone nasal spray three times each day.  Get testosterone injections. For some types of testosterone, your health care provider will give you this injection. With other types of testosterone, you may be taught to give   injections to yourself. The frequency of injections may vary based on the type of testosterone that you receive.  Follow these instructions at home:  Take over-the-counter and prescription medicines only as told by your health care provider.  Lose weight if you are overweight. Ask your health care provider to help you start a healthy diet and exercise  program to reach and maintain a healthy weight.  Work with your health care provider to treat other medical conditions that may lower your testosterone. These include obesity, high blood pressure, high cholesterol, diabetes, liver disease, kidney disease, and sleep apnea.  Keep all follow-up visits as told by your health care provider. This is important. General recommendations  Discuss all risks and benefits with your health care provider before starting therapy.  Work with your health care provider to check your prostate health and do blood testing before you start therapy.  Do not use any testosterone replacement therapies that are not prescribed by your health care provider or not approved for use in the [place]  Do not use TRT for bodybuilding or to improve sexual performance. TRT should be used only to treat symptoms of low testosterone.  Return for all repeat prostate checks and blood tests during therapy, as told by your health care provider. Where to find more information: Learn more about testosterone replacement therapy from:  American Urological Foundation: www.urologyhealth.org/urologic-conditions/low-testosterone-(hypogonadism)  Endocrine Society: www.hormone.org/diseases-and-conditions/mens-health/hypogonadism  Contact a health care provider if:  You have side effects from your testosterone replacement therapy.  You continue to have symptoms of low testosterone during treatment.  You develop new symptoms during treatment. Summary  Testosterone replacement therapy is only for men who have low testosterone as determined by blood testing and who have symptoms of low testosterone.  Testosterone replacement therapy should be prescribed only by a health care provider and should be used under the supervision of a health care provider.  You may not be able to take testosterone if you have certain medical conditions, including prostate cancer, male breast cancer, or heart  disease.  Testosterone replacement therapy may have side effects and may make some medical conditions worse.  Talk with your health care provider about all the risks and benefits before you start therapy. This information is not intended to replace advice given to you by your health care provider. Make sure you discuss any questions you have with your health care provider. Document Released: 09/23/2015 Document Revised: 09/23/2015 Document Reviewed: 09/23/2015 Elsevier Interactive Patient Education  2018 Elsevier Inc.  

## 2017-06-21 ENCOUNTER — Telehealth: Payer: Self-pay | Admitting: Family Medicine

## 2017-06-21 NOTE — Telephone Encounter (Signed)
Copied from [place] [phone]. Topic: Referral - Status >> Jun 21, 2017  3:32 PM Cecelia Byars, NT wrote: Reason for CRM: Patient called to follow up on request for a referral to see sports medicine Dr concerning his finger ,please call him at [phone]

## 2017-06-21 NOTE — Telephone Encounter (Signed)
Patient needs to be scheduled with Dr. Raeford Razor for his finger, referral was placed back in April.

## 2017-06-22 NOTE — Telephone Encounter (Addendum)
I called patient at the number provided to return call [phone]). Left message on patient voicemail to call office and schedule appointment with Dr. Raeford Razor. This is 3rd phone call to patient in reference to this referral. Message was left on patient voicemail 05/10/17, 05/23/17 and 06/22/17.   *Ok for [facility] to schedule, patient has a CRM from 05/10/17 in reference to this referral.

## 2017-08-16 DIAGNOSIS — Z961 Presence of intraocular lens: Secondary | ICD-10-CM | POA: Diagnosis not present

## 2017-08-16 DIAGNOSIS — H5211 Myopia, right eye: Secondary | ICD-10-CM | POA: Diagnosis not present

## 2017-08-16 DIAGNOSIS — H52221 Regular astigmatism, right eye: Secondary | ICD-10-CM | POA: Diagnosis not present

## 2017-08-16 DIAGNOSIS — Z9841 Cataract extraction status, right eye: Secondary | ICD-10-CM | POA: Diagnosis not present

## 2017-08-16 DIAGNOSIS — H25812 Combined forms of age-related cataract, left eye: Secondary | ICD-10-CM | POA: Diagnosis not present

## 2017-08-16 DIAGNOSIS — H179 Unspecified corneal scar and opacity: Secondary | ICD-10-CM | POA: Diagnosis not present

## 2017-09-25 DIAGNOSIS — L810 Postinflammatory hyperpigmentation: Secondary | ICD-10-CM | POA: Diagnosis not present

## 2017-09-25 DIAGNOSIS — L918 Other hypertrophic disorders of the skin: Secondary | ICD-10-CM | POA: Diagnosis not present

## 2017-10-18 DIAGNOSIS — Z9841 Cataract extraction status, right eye: Secondary | ICD-10-CM | POA: Diagnosis not present

## 2017-10-18 DIAGNOSIS — H524 Presbyopia: Secondary | ICD-10-CM | POA: Diagnosis not present

## 2017-10-18 DIAGNOSIS — H5213 Myopia, bilateral: Secondary | ICD-10-CM | POA: Diagnosis not present

## 2017-10-18 DIAGNOSIS — H52223 Regular astigmatism, bilateral: Secondary | ICD-10-CM | POA: Diagnosis not present

## 2017-10-18 DIAGNOSIS — Z961 Presence of intraocular lens: Secondary | ICD-10-CM | POA: Diagnosis not present

## 2017-10-18 DIAGNOSIS — H179 Unspecified corneal scar and opacity: Secondary | ICD-10-CM | POA: Diagnosis not present

## 2017-10-18 DIAGNOSIS — H25812 Combined forms of age-related cataract, left eye: Secondary | ICD-10-CM | POA: Diagnosis not present

## 2017-11-19 DIAGNOSIS — Z23 Encounter for immunization: Secondary | ICD-10-CM | POA: Diagnosis not present

## 2018-01-04 ENCOUNTER — Other Ambulatory Visit: Payer: Self-pay

## 2018-01-04 DIAGNOSIS — K219 Gastro-esophageal reflux disease without esophagitis: Secondary | ICD-10-CM

## 2018-01-04 MED ORDER — LANSOPRAZOLE 30 MG PO CPDR: 30.0000 mg | DELAYED_RELEASE_CAPSULE | Freq: Every day | ORAL | 1 refills | Status: DC

## 2018-02-07 DIAGNOSIS — H5213 Myopia, bilateral: Secondary | ICD-10-CM | POA: Diagnosis not present

## 2018-02-07 DIAGNOSIS — Z961 Presence of intraocular lens: Secondary | ICD-10-CM | POA: Diagnosis not present

## 2018-02-07 DIAGNOSIS — H43813 Vitreous degeneration, bilateral: Secondary | ICD-10-CM | POA: Diagnosis not present

## 2018-02-07 DIAGNOSIS — H52223 Regular astigmatism, bilateral: Secondary | ICD-10-CM | POA: Diagnosis not present

## 2018-02-07 DIAGNOSIS — H33021 Retinal detachment with multiple breaks, right eye: Secondary | ICD-10-CM | POA: Diagnosis not present

## 2018-02-07 DIAGNOSIS — H25812 Combined forms of age-related cataract, left eye: Secondary | ICD-10-CM | POA: Diagnosis not present

## 2018-02-07 DIAGNOSIS — H524 Presbyopia: Secondary | ICD-10-CM | POA: Diagnosis not present

## 2018-02-07 DIAGNOSIS — H33001 Unspecified retinal detachment with retinal break, right eye: Secondary | ICD-10-CM | POA: Diagnosis not present

## 2018-02-07 DIAGNOSIS — H43393 Other vitreous opacities, bilateral: Secondary | ICD-10-CM | POA: Diagnosis not present

## 2018-02-07 DIAGNOSIS — Z9841 Cataract extraction status, right eye: Secondary | ICD-10-CM | POA: Diagnosis not present

## 2018-02-07 DIAGNOSIS — H33323 Round hole, bilateral: Secondary | ICD-10-CM | POA: Diagnosis not present

## 2018-02-07 DIAGNOSIS — H179 Unspecified corneal scar and opacity: Secondary | ICD-10-CM | POA: Diagnosis not present

## 2018-02-08 DIAGNOSIS — I10 Essential (primary) hypertension: Secondary | ICD-10-CM | POA: Diagnosis not present

## 2018-02-08 DIAGNOSIS — H3321 Serous retinal detachment, right eye: Secondary | ICD-10-CM | POA: Diagnosis not present

## 2018-02-08 DIAGNOSIS — H33021 Retinal detachment with multiple breaks, right eye: Secondary | ICD-10-CM

## 2018-02-08 DIAGNOSIS — K219 Gastro-esophageal reflux disease without esophagitis: Secondary | ICD-10-CM | POA: Diagnosis not present

## 2018-02-08 HISTORY — DX: Retinal detachment with multiple breaks, right eye: H33.021

## 2018-02-22 DIAGNOSIS — H43812 Vitreous degeneration, left eye: Secondary | ICD-10-CM | POA: Diagnosis not present

## 2018-03-06 DIAGNOSIS — H43812 Vitreous degeneration, left eye: Secondary | ICD-10-CM | POA: Diagnosis not present

## 2018-03-22 DIAGNOSIS — H35361 Drusen (degenerative) of macula, right eye: Secondary | ICD-10-CM | POA: Diagnosis not present

## 2018-03-22 DIAGNOSIS — H43812 Vitreous degeneration, left eye: Secondary | ICD-10-CM | POA: Diagnosis not present

## 2018-04-25 DIAGNOSIS — H35373 Puckering of macula, bilateral: Secondary | ICD-10-CM | POA: Diagnosis not present

## 2018-06-07 DIAGNOSIS — H33301 Unspecified retinal break, right eye: Secondary | ICD-10-CM | POA: Diagnosis not present

## 2018-06-07 DIAGNOSIS — H5213 Myopia, bilateral: Secondary | ICD-10-CM | POA: Diagnosis not present

## 2018-06-07 DIAGNOSIS — H524 Presbyopia: Secondary | ICD-10-CM | POA: Diagnosis not present

## 2018-06-07 DIAGNOSIS — H52223 Regular astigmatism, bilateral: Secondary | ICD-10-CM | POA: Diagnosis not present

## 2018-06-24 DIAGNOSIS — R7989 Other specified abnormal findings of blood chemistry: Secondary | ICD-10-CM

## 2018-06-24 DIAGNOSIS — M797 Fibromyalgia: Secondary | ICD-10-CM | POA: Insufficient documentation

## 2018-06-24 DIAGNOSIS — I10 Essential (primary) hypertension: Secondary | ICD-10-CM | POA: Insufficient documentation

## 2018-06-24 HISTORY — DX: Fibromyalgia: M79.7

## 2018-06-24 HISTORY — DX: Other specified abnormal findings of blood chemistry: R79.89

## 2018-06-24 HISTORY — DX: Essential (primary) hypertension: I10

## 2018-06-24 NOTE — Progress Notes (Signed)
Cardiology Office Note:    Date:  06/25/2018   ID:  Jeffrey Poole, DOB 07-28-1950, MRN [id number]  PCP:  Libby Maw, MD  Cardiologist:  Shirlee More, MD   Referring MD: Libby Maw,*  ASSESSMENT:    1. Chest pain in adult   2. Essential hypertension   3. SOB (shortness of breath)    PLAN:    In order of problems listed above:  1. His chest pain is not typical angina he has had a previous stress echo several years ago and for further evaluation undergo cardiac CTA.  If he has severe flow-limiting stenosis we need to consider the merits of revascularization. 2. Shortness of breath without a specific etiology differential diagnosis includes heart failure venous thromboembolism embolism pulmonary artery hypertension and anginal equivalent.  Appropriate studies ordered 3. Hypertension stable   Next appointment   Medication Adjustments/Labs and Tests Ordered: Current medicines are reviewed at length with the patient today.  Concerns regarding medicines are outlined above.  No orders of the defined types were placed in this encounter.  No orders of the defined types were placed in this encounter.    No chief complaint on file.   History of Present Illness:    Jeffrey Poole is a 68 y.o. male with a history of hypertension and chest pain last seen at [facility] 06/15/2015 who is being seen today for the evaluation of palpitation at the request of Libby Maw,*.  Previously he is to run on a regular basis has not done it for several years and is interested in restarting an exercise program he is walking outdoors he finds himself vaguely short of breath at times with and without activities and even a little bit of orthopnea.  About a week ago he had several days of dull left precordial pain that radiated to the left axilla up into his left arm is nonexertional unrelieved with rest not severe not pleuritic no associated GI symptoms it was dull in nature and its  resolved.  When seen by me in 2017 he had a normal stress echo.  With his shortness of breath and chest pain he will have further evaluation including d-dimer to screen for venous thromboembolism proBNP level regarding heart failure echocardiogram for shortness of breath and after discussion of options for ischemia evaluation will undergo cardiac CTA.  I will see him back in my office in 4 to 6 weeks and if symptoms worsen become more typical angina he will contact me.  His hypertension stable BP at target and previously he took a minimum dose of an ARB.  He has had no fever chills cough wheezing or sputum production and has no vent rate risk factors for venous thromboembolism. Past Medical History:  Diagnosis Date  . 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 Encounter for health maintenance examination with abnormal findings 05/07/2017  . Fibromyalgia 06/24/2018  . Gastroesophageal reflux disease 05/07/2017  . Hypertension 06/24/2018  . 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 Trigger finger of all digits of both hands 05/07/2017    Past Surgical History:  Procedure Laterality Date  . BACK SURGERY    . RETINAL DETACHMENT SURGERY      Current Medications: Current Meds  Medication Sig  . lansoprazole (PREVACID) 30 MG capsule Take 1 capsule (30 mg total) by mouth daily.     Allergies:   Patient has no known allergies.   Social History   Socioeconomic  History  . Marital status: Married    Spouse name: Not on file  . Number of children: Not on file  . Years of education: Not on file  . Highest education level: Not on file  Occupational History  . Not on file  Social Needs  . Financial resource strain: Not on file  . Food insecurity:    Worry: Not on file    Inability: Not on file  . Transportation needs:    Medical: Not on file    Non-medical: Not on file  Tobacco Use  . Smoking status: Never Smoker  . Smokeless tobacco: Never Used   Substance and Sexual Activity  . Alcohol use: Never    Frequency: Never  . Drug use: Never  . Sexual activity: Not on file  Lifestyle  . Physical activity:    Days per week: Not on file    Minutes per session: Not on file  . Stress: Not on file  Relationships  . Social connections:    Talks on phone: Not on file    Gets together: Not on file    Attends religious service: Not on file    Active member of club or organization: Not on file    Attends meetings of clubs or organizations: Not on file    Relationship status: Not on file  Other Topics Concern  . Not on file  Social History Narrative  . Not on file     Family History: The patient's family history includes Heart Problems in his maternal uncle; Hypertension in his brother and mother.  ROS:   Review of Systems  Constitution: Negative.  HENT: Negative.   Eyes: Negative.   Cardiovascular: Positive for chest pain, dyspnea on exertion and orthopnea.  Respiratory: Positive for shortness of breath.   Endocrine: Negative.   Hematologic/Lymphatic: Negative.   Skin: Negative.   Musculoskeletal: Positive for joint pain.  Gastrointestinal: Negative.   Genitourinary: Negative.   Neurological: Negative.   Psychiatric/Behavioral: Negative.   Allergic/Immunologic: Negative.    Please see the history of present illness.     All other systems reviewed and are negative.  EKGs/Labs/Other Studies Reviewed:    The following studies were reviewed today:   EKG:  EKG is  ordered today.  The ekg ordered today is personally reviewed and demonstrates sinus rhythm APCs nonspecific T waves  Recent Labs: 05/15/2018 CMP is normal potassium 4.2 GFR 86 cc/min minimal elevation of ALT:  Recent Lipid Panel    Component Value Date/Time   CHOL 165 05/07/2017 1608   TRIG 130.0 05/07/2017 1608   HDL 46.90 05/07/2017 1608   CHOLHDL 4 05/07/2017 1608   VLDL 26.0 05/07/2017 1608   LDLCALC 92 05/07/2017 1608    Physical Exam:    VS:   BP 138/84 (BP Location: Left Arm, Patient Position: Sitting, Cuff Size: Large)   Pulse 60   Temp (!) 97 F (36.1 C)   Ht 6\' 3"  (1.905 m)   Wt 251 lb 12.8 oz (114.2 kg)   SpO2 95%   BMI 31.47 kg/m     Wt Readings from Last 3 Encounters:  06/25/18 251 lb 12.8 oz (114.2 kg)  05/21/17 250 lb 2 oz (113.5 kg)  05/07/17 249 lb 2 oz (113 kg)     GEN:  Well nourished, well developed in no acute distress HEENT: Normal NECK: No JVD; No carotid bruits LYMPHATICS: No lymphadenopathy CARDIAC: RRR, no murmurs, rubs, gallops RESPIRATORY:  Clear to auscultation without rales, wheezing or  rhonchi  ABDOMEN: Soft, non-tender, non-distended MUSCULOSKELETAL:  No edema; No deformity  SKIN: Warm and dry NEUROLOGIC:  Alert and oriented x 3 PSYCHIATRIC:  Normal affect     Signed, Shirlee More, MD  06/25/2018 3:24 PM    [facility]

## 2018-06-25 ENCOUNTER — Encounter: Payer: Self-pay | Admitting: Cardiology

## 2018-06-25 ENCOUNTER — Ambulatory Visit (INDEPENDENT_AMBULATORY_CARE_PROVIDER_SITE_OTHER): Payer: Medicare Other | Admitting: Cardiology

## 2018-06-25 ENCOUNTER — Other Ambulatory Visit: Payer: Self-pay

## 2018-06-25 VITALS — BP 138/84 | HR 60 | Temp 97.0°F | Ht 75.0 in | Wt 251.8 lb

## 2018-06-25 DIAGNOSIS — R079 Chest pain, unspecified: Secondary | ICD-10-CM

## 2018-06-25 DIAGNOSIS — Z01812 Encounter for preprocedural laboratory examination: Secondary | ICD-10-CM

## 2018-06-25 DIAGNOSIS — R002 Palpitations: Secondary | ICD-10-CM

## 2018-06-25 DIAGNOSIS — R0602 Shortness of breath: Secondary | ICD-10-CM

## 2018-06-25 DIAGNOSIS — I10 Essential (primary) hypertension: Secondary | ICD-10-CM | POA: Diagnosis not present

## 2018-06-25 DIAGNOSIS — R072 Precordial pain: Secondary | ICD-10-CM

## 2018-06-25 HISTORY — DX: Palpitations: R00.2

## 2018-06-25 MED ORDER — METOPROLOL TARTRATE 50 MG PO TABS: ORAL_TABLET | ORAL | 0 refills | Status: DC

## 2018-06-25 NOTE — Patient Instructions (Addendum)
Medication Instructions:  Your physician recommends that you continue on your current medications as directed. Please refer to the Current Medication list given to you today.  If you need a refill on your cardiac medications before your next appointment, please call your pharmacy.   Lab work: Your physician recommends that you return for lab work in:  TODAY: BMP Pro BNP,D Dimer  3-7 days prior to CT: BMP  If you have labs (blood work) drawn today and your tests are completely normal, you will receive your results only by: Marland Kitchen MyChart Message (if you have MyChart) OR . A paper copy in the mail If you have any lab test that is abnormal or we need to change your treatment, we will call you to review the results.  Testing/Procedures: Your physician has requested that you have an echocardiogram. Echocardiography is a painless test that uses sound waves to create images of your heart. It provides your doctor with information about the size and shape of your heart and how well your heart's chambers and valves are working. This procedure takes approximately one hour. There are no restrictions for this procedure.   Your physician has requested that you have cardiac CT. Cardiac computed tomography (CT) is a painless test that uses an x-ray machine to take clear, detailed pictures of your heart. For further information please visit [facility]. Please follow instruction sheet as given.  Please arrive at the [facility] main entrance of [facility] at xx:xx AM (30-45 minutes prior to test start time)  [facility] [place], [place] [address] [phone]  Proceed to the [facility] Radiology Department (First Floor).  Please follow these instructions carefully (unless otherwise directed):  Hold all erectile dysfunction medications at least 48 hours prior to test.  On the Night Before the Test: . Be sure to Drink plenty of water. . Do not consume any  caffeinated/decaffeinated beverages or chocolate 12 hours prior to your test. . Do not take any antihistamines 12 hours prior to your test.  On the Day of the Test: . Drink plenty of water. Do not drink any water within one hour of the test. . Do not eat any food 4 hours prior to the test. . You may take your regular medications prior to the test.  . Take metoprolol (Lopressor) two hours prior to test.                    -If HR is less than 55 BPM- No Beta Blocker                -IF HR is greater than 55 BPM and patient is less than or equal to 21 yrs old Lopressor 100mg  x1.                    After the Test: . Drink plenty of water. . After receiving IV contrast, you may experience a mild flushed feeling. This is normal. . On occasion, you may experience a mild rash up to 24 hours after the test. This is not dangerous. If this occurs, you can take Benadryl 25 mg and increase your fluid intake. . If you experience trouble breathing, this can be serious. If it is severe call 911 IMMEDIATELY. If it is mild, please call our office.   Follow-Up: At [facility], you and your health needs are our priority.  As part of our continuing mission to provide you with exceptional heart care,  we have created designated Provider Care Teams.  These Care Teams include your primary Cardiologist (physician) and Advanced Practice Providers (APPs -  Physician Assistants and Nurse Practitioners) who all work together to provide you with the care you need, when you need it. You will need a follow up appointment in 6 weeks.  Any Other Special Instructions Will Be Listed Below (If Applicable).

## 2018-06-26 LAB — BASIC METABOLIC PANEL
BUN/Creatinine Ratio: 12 (ref 10–24)
BUN: 12 mg/dL (ref 8–27)
CO2: 20 mmol/L (ref 20–29)
Calcium: 9.6 mg/dL (ref 8.6–10.2)
Chloride: 103 mmol/L (ref 96–106)
Creatinine, Ser: 0.97 mg/dL (ref 0.76–1.27)
GFR calc Af Amer: 92 mL/min/{1.73_m2} (ref >59)
GFR calc non Af Amer: 80 mL/min/{1.73_m2} (ref >59)
Glucose: 100 mg/dL — ABNORMAL HIGH (ref 65–99)
Potassium: 4 mmol/L (ref 3.5–5.2)
Sodium: 139 mmol/L (ref 134–144)

## 2018-06-26 LAB — D-DIMER, QUANTITATIVE (NOT AT ARMC): D-DIMER: 0.34 mg/L FEU (ref 0.00–0.49)

## 2018-06-26 LAB — PRO B NATRIURETIC PEPTIDE: NT-Pro BNP: 27 pg/mL (ref 0–376)

## 2018-07-03 ENCOUNTER — Ambulatory Visit (HOSPITAL_BASED_OUTPATIENT_CLINIC_OR_DEPARTMENT_OTHER)
Admission: RE | Admit: 2018-07-03 | Discharge: 2018-07-03 | Disposition: A | Discharge status: Home / Self Care | Payer: Medicare Other | Source: Ambulatory Visit | Attending: Cardiology | Admitting: Cardiology

## 2018-07-03 ENCOUNTER — Other Ambulatory Visit: Payer: Self-pay

## 2018-07-03 DIAGNOSIS — R0602 Shortness of breath: Secondary | ICD-10-CM

## 2018-07-03 NOTE — Progress Notes (Signed)
  Echocardiogram 2D Echocardiogram has been performed.   Cardell Peach 07/03/2018, 11:34 AM

## 2018-07-05 ENCOUNTER — Telehealth: Payer: Self-pay | Admitting: Cardiology

## 2018-07-05 MED ORDER — METOPROLOL TARTRATE 50 MG PO TABS: ORAL_TABLET | ORAL | 0 refills | Status: DC

## 2018-07-05 NOTE — Telephone Encounter (Signed)
Patient is asking about medicine for hypertension and also needs scheduled for CT

## 2018-07-05 NOTE — Telephone Encounter (Signed)
Patient informed of echocardiogram results.   Patient states his blood pressure has been elevated the past 2 weeks. His BP is ranging from 140/80-200/100. He has been checking his BP twice daily and reports his "average BP is 160/90." Patient reports that he used to take medication for hypertension, but has not taken any in several months. Will have Dr. Bettina Gavia advise.   Patient informed that we are waiting on insurance approval of his cardiac CTA. Advised that this can take up to 1 month to get approved. Patient verbalized understanding and will contact our office if he has not heard from the scheduler within the next few weeks.   No further questions.

## 2018-07-08 MED ORDER — AMLODIPINE BESYLATE 5 MG PO TABS: 5.0000 mg | ORAL_TABLET | Freq: Every day | ORAL | 0 refills | Status: DC

## 2018-07-08 NOTE — Telephone Encounter (Signed)
Amlodipine 5 mg daily #90 refill 2

## 2018-07-08 NOTE — Telephone Encounter (Signed)
Patient informed to start taking amlodipine 5 mg daily to help better control his BP. Patient is agreeable and verbalized understanding. Prescription has been sent to [facility] as requested. No further questions.

## 2018-07-08 NOTE — Telephone Encounter (Signed)
Please advise regarding elevated BP. Thanks!

## 2018-07-08 NOTE — Addendum Note (Signed)
Addended by: Austin Miles on: 07/08/2018 11:13 AM   Modules accepted: Orders

## 2018-07-19 ENCOUNTER — Other Ambulatory Visit: Payer: Self-pay | Admitting: Family Medicine

## 2018-07-19 DIAGNOSIS — K219 Gastro-esophageal reflux disease without esophagitis: Secondary | ICD-10-CM

## 2018-07-24 NOTE — Addendum Note (Signed)
Addended by: Particia Nearing B on: 07/24/2018 12:13 PM   Modules accepted: Orders

## 2018-08-06 ENCOUNTER — Telehealth (HOSPITAL_COMMUNITY): Payer: Self-pay | Admitting: Emergency Medicine

## 2018-08-06 ENCOUNTER — Telehealth: Payer: Medicare Other | Admitting: Cardiology

## 2018-08-06 NOTE — Telephone Encounter (Signed)
Reaching out to patient to offer assistance regarding upcoming cardiac imaging study; pt verbalizes understanding of appt date/time, parking situation and where to check in, pre-test NPO status and medications ordered, and verified current allergies; name and call back number provided for further questions should they arise Trevonne Nyland RN Navigator Cardiac [facility] [phone] office [phone] cell  Pt denies covid symptoms, verbalized understanding of visitor policy. 

## 2018-08-07 ENCOUNTER — Ambulatory Visit (HOSPITAL_COMMUNITY)
Admission: RE | Admit: 2018-08-07 | Discharge: 2018-08-07 | Disposition: A | Discharge status: Home / Self Care | Payer: Medicare Other | Source: Ambulatory Visit | Attending: Cardiology | Admitting: Cardiology

## 2018-08-07 DIAGNOSIS — R072 Precordial pain: Secondary | ICD-10-CM | POA: Diagnosis not present

## 2018-08-07 DIAGNOSIS — R079 Chest pain, unspecified: Secondary | ICD-10-CM | POA: Diagnosis not present

## 2018-08-07 DIAGNOSIS — I2510 Atherosclerotic heart disease of native coronary artery without angina pectoris: Secondary | ICD-10-CM | POA: Diagnosis not present

## 2018-08-07 DIAGNOSIS — R0602 Shortness of breath: Secondary | ICD-10-CM | POA: Diagnosis not present

## 2018-08-07 LAB — POCT I-STAT CREATININE: Creatinine, Ser: 0.9 mg/dL (ref 0.61–1.24)

## 2018-08-07 MED ORDER — NITROGLYCERIN 0.4 MG SL SUBL
SUBLINGUAL_TABLET | SUBLINGUAL | Status: AC
  Administered 2018-08-07: 0.8 mg
  Filled 2018-08-07: qty 2

## 2018-08-07 MED ORDER — NITROGLYCERIN 0.4 MG SL SUBL
SUBLINGUAL_TABLET | SUBLINGUAL | Status: AC
  Filled 2018-08-07: qty 1

## 2018-08-07 MED ORDER — IOHEXOL 350 MG/ML SOLN
100.0000 mL | Freq: Once | INTRAVENOUS | Status: AC | PRN
  Administered 2018-08-07: 16:00:00 100 mL via INTRAVENOUS

## 2018-08-07 MED ORDER — NITROGLYCERIN 0.4 MG SL SUBL: 0.8000 mg | SUBLINGUAL_TABLET | SUBLINGUAL | Status: DC | PRN

## 2018-08-08 NOTE — Progress Notes (Addendum)
Cardiology Office Note:    Date:  08/09/2018   ID:  Jeffrey Poole, DOB 07-Aug-1950, MRN [id number]  PCP:  Jeffrey Maw, MD  Cardiologist:  Jeffrey More, MD    Referring MD: Jeffrey Poole,*    At the time of the visit I advised him to undergo coronary angiography.  Options benefits and risks detailed and understood by the patient he wanted to go home and think call the office and agrees and ask [place] to schedule his heart catheterization.  ASSESSMENT:    1. Coronary artery disease of native artery of native heart with stable angina pectoris ([place])   2. Essential hypertension   3. Enlarged thoracic aorta (HCC)    PLAN:    In order of problems listed above:  1. Cardiac CTA shows high risk anatomy severe LAD stenosis at D1 origin he is symptomatic will take a low-dose aspirin initiate statin and referred for coronary angiography and PCI if appropriate 2. Stable hypertension continue current treatment 3. Mild enlargement I reassured him that it is uncommon to progress on to needing aortic surgery and will likely repeat a CT scan noncontrast 1 to 2 years  30 minutes was spent with the patient reviewing with him the results of the CT scan coronary anatomy calcium score noncardiac findings enlargement of his thoracic aorta formulating a shared decision plan initiating lipid-lowering therapy and referral to coronary angiography   Next appointment: 6 weeks   Medication Adjustments/Labs and Tests Ordered: Current medicines are reviewed at length with the patient today.  Concerns regarding medicines are outlined above.  Orders Placed This Encounter  Procedures  . EKG 12-Lead   Meds ordered this encounter  Medications  . aspirin EC 81 MG tablet    Sig: Take 1 tablet (81 mg total) by mouth daily.    Dispense:  90 tablet    Refill:  3  . rosuvastatin (CRESTOR) 5 MG tablet    Sig: Take 1 tablet (5 mg total) by mouth daily at 6 PM.    Dispense:  30 tablet    Refill:  3     No chief complaint on file.   History of Present Illness:    Jeffrey Poole is a 68 y.o. male with a hx of palpitation, chest pain and hypertension last seen 06/25/2018. Compliance with diet, lifestyle and medications: Yes  He continues to have episodes of nonexertional chest tightness and shortness of breath.  I reviewed the results of his CAT scan with him gave him a copy of the report with highlights after discussion of his anatomy risk and the risk benefits and options of coronary angiography we made a shared decision to proceed to coronary angiography and PCI if appropriate.  He will take a low-dose aspirin daily and initiate a lstatin.  Evaluation showed a low d-dimer and proBNP making pulmonary thromboembolism and heart failure unlikely echocardiogram was normal no findings of heart failure pulmonary hypertension or significant valvular heart disease but there was moderate dilation of the ascending aorta.  Cardiac CTA reported today: IMPRESSION: 1. Coronary calcium score of 167. This was 56th percentile for age and sex matched control. Calcium is seen in the LAD and RCA. 2.  Right dominant circulation. 3. Severe calcified stenosis of the proximal to mid LAD (CADRADS 4) just proximal to the takeoff of the D1 vessel. 4. Sequential mild to moderate calcified, non-obstructive stenoses of the proximal to mid RCA (CADRADS3). 5.  Borderline dilated aortic root at 3.9 cm.  Past Medical History:  Diagnosis Date  . 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 Encounter for health maintenance examination with abnormal findings 05/07/2017  . Fibromyalgia 06/24/2018  . Gastroesophageal reflux disease 05/07/2017  . Hypertension 06/24/2018  . 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 Trigger finger of all digits of both hands 05/07/2017    Past Surgical History:  Procedure Laterality Date  . BACK SURGERY    . RETINAL DETACHMENT SURGERY      Current  Medications: Current Meds  Medication Sig  . amLODipine (NORVASC) 5 MG tablet Take 1 tablet (5 mg total) by mouth daily.  . lansoprazole (PREVACID) 30 MG capsule Take 1 capsule by mouth once daily     Allergies:   Patient has no known allergies.   Social History   Socioeconomic History  . Marital status: Married    Spouse name: Not on file  . Number of children: Not on file  . Years of education: Not on file  . Highest education level: Not on file  Occupational History  . Not on file  Social Needs  . Financial resource strain: Not on file  . Food insecurity    Worry: Not on file    Inability: Not on file  . Transportation needs    Medical: Not on file    Non-medical: Not on file  Tobacco Use  . Smoking status: Never Smoker  . Smokeless tobacco: Never Used  Substance and Sexual Activity  . Alcohol use: Never    Frequency: Never  . Drug use: Never  . Sexual activity: Not on file  Lifestyle  . Physical activity    Days per week: Not on file    Minutes per session: Not on file  . Stress: Not on file  Relationships  . Social Herbalist on phone: Not on file    Gets together: Not on file    Attends religious service: Not on file    Active member of club or organization: Not on file    Attends meetings of clubs or organizations: Not on file    Relationship status: Not on file  Other Topics Concern  . Not on file  Social History Narrative  . Not on file     Family History: The patient's family history includes Heart Problems in his maternal uncle; Hypertension in his brother and mother. ROS:   Please see the history of present illness.    All other systems reviewed and are negative.  EKGs/Labs/Other Studies Reviewed:    The following studies were reviewed today  Neck CTA report released today showing severe LAD stenosis at D1 high risk anatomy recommendation of coronary angiography.  His overall calcium score was intermediate he had no extracardiac  findings on the CT scan and had no other significant CAD he had mild enlargement of the ascending aorta  Recent Labs: 06/25/2018: BUN 12; NT-Pro BNP 27; Potassium 4.0; Sodium 139 08/07/2018: Creatinine, Ser 0.90  Recent Lipid Panel    Component Value Date/Time   CHOL 165 05/07/2017 1608   TRIG 130.0 05/07/2017 1608   HDL 46.90 05/07/2017 1608   CHOLHDL 4 05/07/2017 1608   VLDL 26.0 05/07/2017 1608   LDLCALC 92 05/07/2017 1608    Physical Exam:    VS:  BP 124/84 (BP Location: Right Arm, Patient Position: Sitting, Cuff Size: Large)   Pulse (!) 56   Temp (!) 97.3 F (36.3 C)   Ht  6\' 3"  (1.905 m)   Wt 252 lb (114.3 kg)   SpO2 96%   BMI 31.50 kg/m     Wt Readings from Last 3 Encounters:  08/09/18 252 lb (114.3 kg)  06/25/18 251 lb 12.8 oz (114.2 kg)  05/21/17 250 lb 2 oz (113.5 kg)     GEN:  Well nourished, well developed in no acute distress HEENT: Normal NECK: No JVD; No carotid bruits LYMPHATICS: No lymphadenopathy CARDIAC: RRR, no murmurs, rubs, gallops RESPIRATORY:  Clear to auscultation without rales, wheezing or rhonchi  ABDOMEN: Soft, non-tender, non-distended MUSCULOSKELETAL:  No edema; No deformity  SKIN: Warm and dry NEUROLOGIC:  Alert and oriented x 3 PSYCHIATRIC:  Normal affect    Signed, Jeffrey More, MD  08/09/2018 12:01 PM    [facility]

## 2018-08-09 ENCOUNTER — Telehealth: Payer: Self-pay | Admitting: Cardiology

## 2018-08-09 ENCOUNTER — Other Ambulatory Visit: Payer: Self-pay

## 2018-08-09 ENCOUNTER — Telehealth: Payer: Medicare Other | Admitting: Cardiology

## 2018-08-09 ENCOUNTER — Ambulatory Visit (INDEPENDENT_AMBULATORY_CARE_PROVIDER_SITE_OTHER): Payer: Medicare Other | Admitting: Cardiology

## 2018-08-09 VITALS — BP 124/84 | HR 56 | Temp 97.3°F | Ht 75.0 in | Wt 252.0 lb

## 2018-08-09 DIAGNOSIS — I25118 Atherosclerotic heart disease of native coronary artery with other forms of angina pectoris: Secondary | ICD-10-CM | POA: Diagnosis not present

## 2018-08-09 DIAGNOSIS — I7789 Other specified disorders of arteries and arterioles: Secondary | ICD-10-CM | POA: Diagnosis not present

## 2018-08-09 DIAGNOSIS — I10 Essential (primary) hypertension: Secondary | ICD-10-CM

## 2018-08-09 MED ORDER — ROSUVASTATIN CALCIUM 5 MG PO TABS: 5.0000 mg | ORAL_TABLET | Freq: Every day | ORAL | 3 refills | Status: DC

## 2018-08-09 MED ORDER — ASPIRIN EC 81 MG PO TBEC: 81.0000 mg | DELAYED_RELEASE_TABLET | Freq: Every day | ORAL | 3 refills | Status: AC

## 2018-08-09 NOTE — Patient Instructions (Signed)
Medication Instructions:  Your physician has recommended you make the following change in your medication:   START: Aspirin 81 mg : TAke 1 tab daily START: Rosuvastatin 5 mg : Take 1 tab daily in the evening  If you need a refill on your cardiac medications before your next appointment, please call your pharmacy.   Lab work: None If you have labs (blood work) drawn today and your tests are completely normal, you will receive your results only by: Marland Kitchen MyChart Message (if you have MyChart) OR . A paper copy in the mail If you have any lab test that is abnormal or we need to change your treatment, we will call you to review the results.  Testing/Procedures: NOne  Follow-Up: At [facility], you and your health needs are our priority.  As part of our continuing mission to provide you with exceptional heart care, we have created designated Provider Care Teams.  These Care Teams include your primary Cardiologist (physician) and Advanced Practice Providers (APPs -  Physician Assistants and Nurse Practitioners) who all work together to provide you with the care you need, when you need it. You will need a follow up appointment in 4 weeks.  Any Other Special Instructions Will Be Listed Below (If Applicable).

## 2018-08-09 NOTE — Telephone Encounter (Signed)
Patient called to say he is ready for his stent surgery to be set up.

## 2018-08-12 NOTE — Telephone Encounter (Signed)
Please advise if cardiac catheterization needs to be arranged and place orders if appropriate. Thanks!

## 2018-08-12 NOTE — Addendum Note (Signed)
Addended by: Shirlee More on: 08/12/2018 10:43 AM   Modules accepted: Orders, SmartSet

## 2018-08-13 NOTE — Telephone Encounter (Signed)
Dr. Bettina Gavia has advised that cardiac catheterization can be scheduled as he has placed the orders.   Left message on patient's cell phone and patient's wife, Linda's, cell phone per DPR to return call to discuss.

## 2018-08-14 MED ORDER — NITROGLYCERIN 0.4 MG SL SUBL: 0.4000 mg | SUBLINGUAL_TABLET | SUBLINGUAL | 3 refills | Status: DC | PRN

## 2018-08-14 NOTE — Telephone Encounter (Signed)
Patient returned call and explained that he is still very hesitant about scheduling the catheterization and prefers to move up his follow up appointment with Dr. Bettina Gavia to be able to discuss his concerns further before proceeding. Patient's appointment has been moved from 09/25/2018 to 08/26/2018 at 3:15 pm in the [facility] office. Patient is agreeable.   Patient asked if Dr. Bettina Gavia would prescribe him some nitroglycerin to use as needed for chest pain in the meantime. Will have Dr. Bettina Gavia Advise.

## 2018-08-14 NOTE — Telephone Encounter (Signed)
Patient called and advised that he can be prescribed nitroglycerin to use as needed for chest pain. Explained the nitroglycerin protocol to patient and he verbalized understanding. Prescription sent to [facility] in [place] as requested. No further questions.

## 2018-08-14 NOTE — Telephone Encounter (Signed)
Left message for patient and patient's wife, Vaughan Basta, to return call to discuss arranging cardiac catheterization.

## 2018-08-14 NOTE — Telephone Encounter (Signed)
Yes please send a prescription for nitroglycerin

## 2018-08-25 NOTE — Progress Notes (Signed)
Cardiology Office Note:    Date:  08/26/2018   ID:  [name], DOB Mar 09, 1950, MRN [id number]  PCP:  Libby Maw, MD  Cardiologist:  Shirlee More, MD    Referring MD: Libby Maw,*    ASSESSMENT:    1. Coronary artery disease of native artery of native heart with stable angina pectoris ([place])   2. Essential hypertension   3. Enlarged thoracic aorta (HCC)    PLAN:    In order of problems listed above:  1. Very nice opportunity to tell patient and wife review his cardiac CTA high risk reinforced my recommendation for coronary angiography.  He remains hesitant we will continue medical treatment if he changes his mind he will contact me and I will plan to see him in 3 months to keep a close eye with medical therapy 2. Stable continue current antihypertensive 3. Continue statin check lipids next visit   Next appointment: 3 months   Medication Adjustments/Labs and Tests Ordered: Current medicines are reviewed at length with the patient today.  Concerns regarding medicines are outlined above.  No orders of the defined types were placed in this encounter.  No orders of the defined types were placed in this encounter.   No chief complaint on file.   History of Present Illness:    Jeffrey Poole is a 68 y.o. male with a hx of  palpitation, chest pain and hypertension  last seen 08/09/2018 aftger a hugh risk cardiac CTA.  Cardiac CTA reported today: IMPRESSION: 1. Coronary calcium score of 167. This was 56th percentile for age and sex matched control. Calcium is seen in the LAD and RCA. 2. Right dominant circulation. 3. Severe calcified stenosis of the proximal to mid LAD (CADRADS 4) just proximal to the takeoff of the D1 vessel. 4. Sequential mild to moderate calcified, non-obstructive stenoses of the proximal to mid RCA (CADRADS3). 5. Borderline dilated aortic root at 3.9 cm.  Compliance with diet, lifestyle and medications: Yes  He is here with  his wife we reviewed the results results of his cardiac CTA he has exercise intolerance exertional dyspnea has had exertional chest tightness but not severe has not interrupted activities and has not needed nitroglycerin reviewed the risk benefits and options of coronary angiography PCI and stent.  He remains hesitant and has decided to defer intervention at this time Past Medical History:  Diagnosis Date  . 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 Encounter for health maintenance examination with abnormal findings 05/07/2017  . Fibromyalgia 06/24/2018  . Gastroesophageal reflux disease 05/07/2017  . Hypertension 06/24/2018  . 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 Trigger finger of all digits of both hands 05/07/2017    Past Surgical History:  Procedure Laterality Date  . BACK SURGERY    . RETINAL DETACHMENT SURGERY      Current Medications: Current Meds  Medication Sig  . amLODipine (NORVASC) 5 MG tablet Take 5 mg by mouth 2 (two) times daily.  Marland Kitchen aspirin EC 81 MG tablet Take 1 tablet (81 mg total) by mouth daily.  . lansoprazole (PREVACID) 30 MG capsule Take 1 capsule by mouth once daily  . nitroGLYCERIN (NITROSTAT) 0.4 MG SL tablet Place 1 tablet (0.4 mg total) under the tongue every 5 (five) minutes as needed.  . rosuvastatin (CRESTOR) 5 MG tablet Take 1 tablet (5 mg total) by mouth daily at 6 PM.     Allergies:   Patient has  no known allergies.   Social History   Socioeconomic History  . Marital status: Married    Spouse name: Not on file  . Number of children: Not on file  . Years of education: Not on file  . Highest education level: Not on file  Occupational History  . Not on file  Social Needs  . Financial resource strain: Not on file  . Food insecurity    Worry: Not on file    Inability: Not on file  . Transportation needs    Medical: Not on file    Non-medical: Not on file  Tobacco Use  . Smoking status: Never Smoker   . Smokeless tobacco: Never Used  Substance and Sexual Activity  . Alcohol use: Never    Frequency: Never  . Drug use: Never  . Sexual activity: Not on file  Lifestyle  . Physical activity    Days per week: Not on file    Minutes per session: Not on file  . Stress: Not on file  Relationships  . Social Herbalist on phone: Not on file    Gets together: Not on file    Attends religious service: Not on file    Active member of club or organization: Not on file    Attends meetings of clubs or organizations: Not on file    Relationship status: Not on file  Other Topics Concern  . Not on file  Social History Narrative  . Not on file     Family History: The patient's family history includes Heart Problems in his maternal uncle; Hypertension in his brother and mother. ROS:   Please see the history of present illness.    All other systems reviewed and are negative.  EKGs/Labs/Other Studies Reviewed:    The following studies were reviewed today  Recent Labs: 06/25/2018: BUN 12; NT-Pro BNP 27; Potassium 4.0; Sodium 139 08/07/2018: Creatinine, Ser 0.90  Recent Lipid Panel    Component Value Date/Time   CHOL 165 05/07/2017 1608   TRIG 130.0 05/07/2017 1608   HDL 46.90 05/07/2017 1608   CHOLHDL 4 05/07/2017 1608   VLDL 26.0 05/07/2017 1608   LDLCALC 92 05/07/2017 1608    Physical Exam:    VS:  BP (!) 142/84 (BP Location: Left Arm, Patient Position: Sitting, Cuff Size: Large)   Pulse 70   Ht 6\' 3"  (1.905 m)   Wt 249 lb 1.9 oz (113 kg)   SpO2 98%   BMI 31.14 kg/m     Wt Readings from Last 3 Encounters:  08/26/18 249 lb 1.9 oz (113 kg)  08/09/18 252 lb (114.3 kg)  06/25/18 251 lb 12.8 oz (114.2 kg)     GEN:  Well nourished, well developed in no acute distress HEENT: Normal NECK: No JVD; No carotid bruits LYMPHATICS: No lymphadenopathy CARDIAC: RRR, no murmurs, rubs, gallops RESPIRATORY:  Clear to auscultation without rales, wheezing or rhonchi  ABDOMEN:  Soft, non-tender, non-distended MUSCULOSKELETAL:  No edema; No deformity  SKIN: Warm and dry NEUROLOGIC:  Alert and oriented x 3 PSYCHIATRIC:  Normal affect    Signed, Shirlee More, MD  08/26/2018 4:10 PM    [facility]

## 2018-08-26 ENCOUNTER — Ambulatory Visit (INDEPENDENT_AMBULATORY_CARE_PROVIDER_SITE_OTHER): Payer: Medicare Other | Admitting: Cardiology

## 2018-08-26 ENCOUNTER — Other Ambulatory Visit: Payer: Self-pay

## 2018-08-26 ENCOUNTER — Encounter: Payer: Self-pay | Admitting: Cardiology

## 2018-08-26 VITALS — BP 142/84 | HR 70 | Ht 75.0 in | Wt 249.1 lb

## 2018-08-26 DIAGNOSIS — I25118 Atherosclerotic heart disease of native coronary artery with other forms of angina pectoris: Secondary | ICD-10-CM | POA: Diagnosis not present

## 2018-08-26 DIAGNOSIS — I10 Essential (primary) hypertension: Secondary | ICD-10-CM

## 2018-08-26 DIAGNOSIS — I7789 Other specified disorders of arteries and arterioles: Secondary | ICD-10-CM | POA: Diagnosis not present

## 2018-08-26 NOTE — Patient Instructions (Signed)

## 2018-08-29 NOTE — Addendum Note (Signed)
Addended by: Shirlee More on: 08/29/2018 12:54 PM   Modules accepted: Orders

## 2018-08-30 ENCOUNTER — Other Ambulatory Visit: Payer: Self-pay | Admitting: Cardiology

## 2018-09-03 ENCOUNTER — Telehealth: Payer: Self-pay | Admitting: Cardiology

## 2018-09-03 NOTE — Telephone Encounter (Signed)
Patient having issues with BP, please advise

## 2018-09-04 NOTE — Telephone Encounter (Signed)
Amlodipine refill sent

## 2018-09-04 NOTE — Telephone Encounter (Signed)
Left message to return call 

## 2018-09-12 NOTE — Telephone Encounter (Signed)
Left message to return call 

## 2018-09-16 ENCOUNTER — Encounter: Payer: Self-pay | Admitting: Family

## 2018-09-16 ENCOUNTER — Ambulatory Visit (INDEPENDENT_AMBULATORY_CARE_PROVIDER_SITE_OTHER): Payer: Medicare Other | Admitting: Family

## 2018-09-16 ENCOUNTER — Other Ambulatory Visit: Payer: Self-pay

## 2018-09-16 VITALS — BP 150/88 | HR 56 | Ht 75.0 in | Wt 249.0 lb

## 2018-09-16 DIAGNOSIS — I7789 Other specified disorders of arteries and arterioles: Secondary | ICD-10-CM

## 2018-09-16 DIAGNOSIS — I25118 Atherosclerotic heart disease of native coronary artery with other forms of angina pectoris: Secondary | ICD-10-CM | POA: Diagnosis not present

## 2018-09-16 DIAGNOSIS — E782 Mixed hyperlipidemia: Secondary | ICD-10-CM | POA: Diagnosis not present

## 2018-09-16 DIAGNOSIS — I10 Essential (primary) hypertension: Secondary | ICD-10-CM

## 2018-09-16 MED ORDER — AMLODIPINE BESYLATE 10 MG PO TABS: 10.0000 mg | ORAL_TABLET | Freq: Every day | ORAL | 2 refills | Status: DC

## 2018-09-16 NOTE — Patient Instructions (Addendum)
Medication Instructions:   Your physician has recommended you make the following change in your medication:   CHANGE Amlodipine to 10mg  daily  If you need a refill on your cardiac medications before your next appointment, please call your pharmacy.   Lab work: Your physician recommends that you return for lab work today: troponin,CMET, lipid,   If you have labs (blood work) drawn today and your tests are completely normal, you will receive your results only by: Marland Kitchen MyChart Message (if you have MyChart) OR . A paper copy in the mail If you have any lab test that is abnormal or we need to change your treatment, we will call you to review the results.  Testing/Procedures: You had an EKG today.   Follow-Up: At [facility], you and your health needs are our priority.  As part of our continuing mission to provide you with exceptional heart care, we have created designated Provider Care Teams.  These Care Teams include your primary Cardiologist (physician) and Advanced Practice Providers (APPs -  Physician Assistants and Nurse Practitioners) who all work together to provide you with the care you need, when you need it. You will need a follow up appointment in 2 months.   You may see Shirlee More, MD or another member of our [facility] Provider Team in [place]: Jenne Campus, MD . Jyl Heinz, MD  Any Other Special Instructions Will Be Listed Below (If Applicable).  If you need to take nitrogylcerin, please sit down and rest for at least 30 minutes.  We anticipate your blood pressure will be low.   Please check blood pressure and keep a log.  Call our office or send a message via MyChart with a report of blood pressure in 2 weeks.

## 2018-09-16 NOTE — Progress Notes (Signed)
Office Visit    Patient Name: Jeffrey Poole Date of Encounter: 09/16/2018  Primary Care Provider:  Libby Maw, MD Primary Cardiologist:  Shirlee More, MD  Chief Complaint    68 yo male with PMH CAD, HTN, enlarged thoracic aorta presents today with chief complaint of labile BP.   Past Medical History    Past Medical History:  Diagnosis Date  . 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 Encounter for health maintenance examination with abnormal findings 05/07/2017  . Fibromyalgia 06/24/2018  . Gastroesophageal reflux disease 05/07/2017  . Hypertension 06/24/2018  . 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 Trigger finger of all digits of both hands 05/07/2017   Past Surgical History:  Procedure Laterality Date  . BACK SURGERY    . RETINAL DETACHMENT SURGERY      Allergies  No Known Allergies  History of Present Illness    Jeffrey Poole is a 68 y.o. male with a hx of CAD, HTN, enlarged thoracic aorta, HLD, GERD last seen by Dr. Bettina Gavia 08/26/18.  He re-established cardiology care 06/2018 after 2017 normal stress echo. At that time he noted shortness of breath and chest pain. Echo 06/2018 with EF 55-60%, mild LVH, impaired relaxation, RV normal, mild-mod dilatation ascending aorta (47mm).  CTA 08/07/18 with noted severe LAD disease. At his follow up with Dr. Bettina Gavia 08/26/18 he declined cardiac cath.   Present today with his wife. Episode of chest pain at rest while lying down this morning. States it was a "sharp" pain in his left chest that did not radiate. Described as "hit or miss" and that it "wasn't that bad". Tells me he took one nitroglycerin and felt sweaty, cold, SOB, and nauseous. Tells me he thinks it could be a reaction to the nitroglycerin.   He took a nitroglycerin last week for some minor chest pain and tells me he did not have this same reaction. We discussed the mechanism of nitroglycerin. We discussed his cardiac CTA  and recommendation for cardiac cath. He is hesitant to proceed with cardiac catheterization. Tells me he does not think his episodes of chest discomfort are getting more frequent.   Tells me he yesterday he walked 1.5 miles with no SOB, no DOE, no chest pain. He is trying to resume an exercise regimen of walking, this was encouraged.   EKGs/Labs/Other Studies Reviewed:   The following studies were reviewed today:  Cardiac CTA 08/07/18 IMPRESSION: 1. Coronary calcium score of 167. This was 56th percentile for age and sex matched control. Calcium is seen in the LAD and RCA.   2.  Right dominant circulation.   3. Severe calcified stenosis of the proximal to mid LAD (CADRADS 4) just proximal to the takeoff of the D1 vessel.   4. Sequential mild to moderate calcified, non-obstructive stenoses of the proximal to mid RCA (CADRADS3).   5.  Borderline dilated aortic root at 3.9 cm.   5.  Recommend cardiac catheterization.    Echo 07/03/18    1. The left ventricle has normal systolic function, with an ejection fraction of 55-60%. The cavity size was normal. There is mild concentric left ventricular hypertrophy. Left ventricular diastolic Doppler parameters are consistent with impaired  relaxation.  2. The right ventricle has normal systolic function. The cavity was normal. There is no increase in right ventricular wall thickness.  3. The aortic root and aortic arch are normal in size and structure.  4. There  is mild to moderate dilatation of the ascending aorta measuring 40 mm.   EKG:  EKG is ordered today.  The ekg ordered today demonstrates SB rate 56 bpm stable compared to previous.   Recent Labs: 06/25/2018: BUN 12; NT-Pro BNP 27; Potassium 4.0; Sodium 139 08/07/2018: Creatinine, Ser 0.90  Recent Lipid Panel    Component Value Date/Time   CHOL 165 05/07/2017 1608   TRIG 130.0 05/07/2017 1608   HDL 46.90 05/07/2017 1608   CHOLHDL 4 05/07/2017 1608   VLDL 26.0 05/07/2017 1608    LDLCALC 92 05/07/2017 1608    Home Medications   Current Meds  Medication Sig  . aspirin EC 81 MG tablet Take 1 tablet (81 mg total) by mouth daily.  . lansoprazole (PREVACID) 30 MG capsule Take 1 capsule by mouth once daily  . nitroGLYCERIN (NITROSTAT) 0.4 MG SL tablet Place 1 tablet (0.4 mg total) under the tongue every 5 (five) minutes as needed.  . rosuvastatin (CRESTOR) 5 MG tablet Take 1 tablet (5 mg total) by mouth daily at 6 PM.  . [DISCONTINUED] amLODipine (NORVASC) 5 MG tablet Take 1 tablet by mouth once daily      Review of Systems     Review of Systems  Constitution: Positive for chills and diaphoresis. Negative for malaise/fatigue.  Cardiovascular: Positive for chest pain and dyspnea on exertion. Negative for leg swelling, near-syncope, orthopnea and palpitations.  Respiratory: Negative for cough, shortness of breath and wheezing.   Gastrointestinal: Negative for nausea and vomiting.  Neurological: Negative for dizziness, light-headedness and weakness.   All other systems reviewed and are otherwise negative except as noted above.  Physical Exam    VS:  BP (!) 150/88   Pulse (!) 56   Ht 6\' 3"  (1.905 m)   Wt 249 lb (112.9 kg)   SpO2 98%   BMI 31.12 kg/m  , BMI Body mass index is 31.12 kg/m. GEN: Well nourished, well developed, in no acute distress. HEENT: normal. Neck: Supple, no JVD, carotid bruits, or masses. Cardiac: RRR, no murmurs, rubs, or gallops. No clubbing, cyanosis, edema.  Radials/DP/PT 2+ and equal bilaterally.  Respiratory:  Respirations regular and unlabored, clear to auscultation bilaterally. GI: Soft, nontender, nondistended, BS + x 4. MS: No deformity or atrophy. Skin: Warm and dry, no rash. Neuro:  Strength and sensation are intact. Psych: Normal affect.  Accessory Clinical Findings    ECG personally reviewed by me today - sinus bradycardia rate 56 bpm stable compared to previous - no acute changes.  Assessment & Plan    1. CAD -  Describes episode of chest pain around noon described as "sharp" and "hit or miss" sensation in his left chest that "wasn't that bad". Occurred while at rest. Very atypical for anginal pain. Took one nitroglycerin and felt sweaty, cold, nauseous, short of breath. BP dropped to 90s/60s. We discussed mechanism of nitroglycerin and potential side effects.   EKG SB without acute ST/T wave changes.   Discussed his cardiac CTA from July with severe obstruction to LAD and encouraged cardiac cath, he declines.   Today we will check troponin.   GDMT aspirin, statin. No beta blocker secondary to bradycardia.   2. HTN - Checks BP daily at home with SBP readings 130s-140s. Increase Amlodipine to 10mg  daily. Informed to monitor BP daily and call in 2 weeks with report. Anticipate he will require a second agent.  3. Enlarged thoracic aorta - Echo 06/2018 with ascending aorta measurement 75mm. Will require CT 06/2019  to follow. Emphasized importance of blood pressure goal <130/80.   4. HLD - Started on Crestor 08/09/18. Recheck lipid profile, CMET today.   Disposition: Call report of BP in 2 weeks. Follow up in Nov with Dr. Bettina Gavia as scheduled.   Loel Dubonnet, NP 09/16/2018, 3:11 PM

## 2018-09-16 NOTE — Telephone Encounter (Signed)
Patient seen by Laurann Montana, NP and these issues were addressed.

## 2018-09-17 ENCOUNTER — Telehealth: Payer: Self-pay | Admitting: Family

## 2018-09-17 DIAGNOSIS — E782 Mixed hyperlipidemia: Secondary | ICD-10-CM

## 2018-09-17 DIAGNOSIS — Z79899 Other long term (current) drug therapy: Secondary | ICD-10-CM

## 2018-09-17 LAB — COMPREHENSIVE METABOLIC PANEL
ALT: 70 IU/L — ABNORMAL HIGH (ref 0–44)
AST: 39 IU/L (ref 0–40)
Albumin/Globulin Ratio: 1.7 (ref 1.2–2.2)
Albumin: 4.9 g/dL — ABNORMAL HIGH (ref 3.8–4.8)
Alkaline Phosphatase: 109 IU/L (ref 39–117)
BUN/Creatinine Ratio: 10 (ref 10–24)
BUN: 11 mg/dL (ref 8–27)
Bilirubin Total: 0.5 mg/dL (ref 0.0–1.2)
CO2: 21 mmol/L (ref 20–29)
Calcium: 9.6 mg/dL (ref 8.6–10.2)
Chloride: 104 mmol/L (ref 96–106)
Creatinine, Ser: 1.05 mg/dL (ref 0.76–1.27)
GFR calc Af Amer: 84 mL/min/{1.73_m2} (ref >59)
GFR calc non Af Amer: 73 mL/min/{1.73_m2} (ref >59)
Globulin, Total: 2.9 g/dL (ref 1.5–4.5)
Glucose: 110 mg/dL — ABNORMAL HIGH (ref 65–99)
Potassium: 4.2 mmol/L (ref 3.5–5.2)
Sodium: 141 mmol/L (ref 134–144)
Total Protein: 7.8 g/dL (ref 6.0–8.5)

## 2018-09-17 LAB — LIPID PANEL
Chol/HDL Ratio: 2.6 ratio (ref 0.0–5.0)
Cholesterol, Total: 119 mg/dL (ref 100–199)
HDL: 45 mg/dL (ref >39)
LDL Chol Calc (NIH): 47 mg/dL (ref 0–99)
Triglycerides: 158 mg/dL — ABNORMAL HIGH (ref 0–149)
VLDL Cholesterol Cal: 27 mg/dL (ref 5–40)

## 2018-09-17 LAB — LDL CHOLESTEROL, DIRECT: LDL Direct: 57 mg/dL (ref 0–99)

## 2018-09-17 LAB — TROPONIN I: Troponin I: <0.01 ng/mL (ref 0.00–0.04)

## 2018-09-17 NOTE — Telephone Encounter (Signed)
Called and spoke with Mr. Safer. Reviewed all lab results.   Liver function mildly elevated, but similar compared to previous. Do not anticipate this is result of Crestor. He is agreeable to repeat liver function tests for monitoring. 04/2017 ALT 60 then 09/16/18 ALT 70. If liver enzymes continue to rise consider reduced dose versus referral to lipid clinic for PCSK9.  Negative troponin. Normal kidney function. LDL at goal <70. Triglycerides elevated - educated to avoid added sugars, fried foods.  Loel Dubonnet, NP

## 2018-09-25 ENCOUNTER — Ambulatory Visit: Payer: Medicare Other | Admitting: Cardiology

## 2018-10-11 ENCOUNTER — Telehealth: Payer: Self-pay | Admitting: Family

## 2018-10-11 DIAGNOSIS — I10 Essential (primary) hypertension: Secondary | ICD-10-CM

## 2018-10-11 MED ORDER — HYDROCHLOROTHIAZIDE 25 MG PO TABS: 25.0000 mg | ORAL_TABLET | Freq: Every day | ORAL | 2 refills | Status: DC

## 2018-10-11 NOTE — Telephone Encounter (Signed)
Jeffrey Poole called to report his blood pressure per my request at our last office visit. In setting of enlarged thoracic aorta measuring 52mm requires goal BP <130/80.  Reports his systolic blood pressures consistently remain 130s-150s despite increased Amlodipine dose. He is agreeable to start a second agent. Start Hydrochlorothiazide 25mg  daily.   We will check a CMET in 2 weeks on Thurs, Oct 8th. This will assess renal function after medication change and re-evaluate liver function as ALT with slight increase 04/2017 ALT 60 to 08/29/18 ALT 70.   I will call him Fri, Oct 8 with lab results and to discuss BP. He will continue to check BP daily.   Loel Dubonnet, NP

## 2018-10-29 ENCOUNTER — Other Ambulatory Visit: Payer: Self-pay | Admitting: Emergency Medicine

## 2018-10-29 ENCOUNTER — Telehealth: Payer: Self-pay | Admitting: Emergency Medicine

## 2018-10-29 DIAGNOSIS — Z79899 Other long term (current) drug therapy: Secondary | ICD-10-CM

## 2018-10-29 DIAGNOSIS — E782 Mixed hyperlipidemia: Secondary | ICD-10-CM | POA: Diagnosis not present

## 2018-10-29 NOTE — Telephone Encounter (Signed)
Patient in office for a CMP also asking for  Lipid panel since he is fasting today. Added this on per Dr. Agustin Cree.

## 2018-10-30 ENCOUNTER — Encounter: Payer: Self-pay | Admitting: Family

## 2018-10-30 LAB — COMPREHENSIVE METABOLIC PANEL
ALT: 64 IU/L — ABNORMAL HIGH (ref 0–44)
AST: 41 IU/L — ABNORMAL HIGH (ref 0–40)
Albumin/Globulin Ratio: 1.5 (ref 1.2–2.2)
Albumin: 4.4 g/dL (ref 3.8–4.8)
Alkaline Phosphatase: 105 IU/L (ref 39–117)
BUN/Creatinine Ratio: 15 (ref 10–24)
BUN: 15 mg/dL (ref 8–27)
Bilirubin Total: 0.7 mg/dL (ref 0.0–1.2)
CO2: 25 mmol/L (ref 20–29)
Calcium: 9.6 mg/dL (ref 8.6–10.2)
Chloride: 103 mmol/L (ref 96–106)
Creatinine, Ser: 1.03 mg/dL (ref 0.76–1.27)
GFR calc Af Amer: 86 mL/min/{1.73_m2} (ref >59)
GFR calc non Af Amer: 74 mL/min/{1.73_m2} (ref >59)
Globulin, Total: 3 g/dL (ref 1.5–4.5)
Glucose: 111 mg/dL — ABNORMAL HIGH (ref 65–99)
Potassium: 4.3 mmol/L (ref 3.5–5.2)
Sodium: 141 mmol/L (ref 134–144)
Total Protein: 7.4 g/dL (ref 6.0–8.5)

## 2018-10-30 LAB — LIPID PANEL
Chol/HDL Ratio: 2.5 ratio (ref 0.0–5.0)
Cholesterol, Total: 108 mg/dL (ref 100–199)
HDL: 43 mg/dL (ref >39)
LDL Chol Calc (NIH): 44 mg/dL (ref 0–99)
Triglycerides: 119 mg/dL (ref 0–149)
VLDL Cholesterol Cal: 21 mg/dL (ref 5–40)

## 2018-11-01 ENCOUNTER — Telehealth: Payer: Self-pay | Admitting: Emergency Medicine

## 2018-11-01 NOTE — Telephone Encounter (Signed)
Left results on patient's voicemail per dpr. Advised patient to return call with any questions.  

## 2018-11-04 ENCOUNTER — Telehealth: Payer: Self-pay

## 2018-11-04 NOTE — Telephone Encounter (Signed)
Phoned patient, informed of lab results. States BP's average 120-125/60-80 with heart rates 50-60.

## 2018-11-09 ENCOUNTER — Other Ambulatory Visit: Payer: Self-pay | Admitting: Family Medicine

## 2018-11-09 DIAGNOSIS — K219 Gastro-esophageal reflux disease without esophagitis: Secondary | ICD-10-CM

## 2018-11-27 ENCOUNTER — Ambulatory Visit (INDEPENDENT_AMBULATORY_CARE_PROVIDER_SITE_OTHER): Payer: Medicare Other | Admitting: Cardiology

## 2018-11-27 ENCOUNTER — Encounter: Payer: Self-pay | Admitting: Cardiology

## 2018-11-27 ENCOUNTER — Other Ambulatory Visit: Payer: Self-pay

## 2018-11-27 VITALS — BP 124/82 | HR 60 | Ht 75.0 in | Wt 236.4 lb

## 2018-11-27 DIAGNOSIS — I10 Essential (primary) hypertension: Secondary | ICD-10-CM | POA: Diagnosis not present

## 2018-11-27 DIAGNOSIS — I7789 Other specified disorders of arteries and arterioles: Secondary | ICD-10-CM

## 2018-11-27 DIAGNOSIS — E782 Mixed hyperlipidemia: Secondary | ICD-10-CM | POA: Diagnosis not present

## 2018-11-27 DIAGNOSIS — I25118 Atherosclerotic heart disease of native coronary artery with other forms of angina pectoris: Secondary | ICD-10-CM

## 2018-11-27 NOTE — Patient Instructions (Signed)
Medication Instructions:  Your physician recommends that you continue on your current medications as directed. Please refer to the Current Medication list given to you today.  *If you need a refill on your cardiac medications before your next appointment, please call your pharmacy*  Lab Work: NONE If you have labs (blood work) drawn today and your tests are completely normal, you will receive your results only by: . MyChart Message (if you have MyChart) OR . A paper copy in the mail If you have any lab test that is abnormal or we need to change your treatment, we will call you to review the results.  Testing/Procedures: NONE  Follow-Up: At [facility], you and your health needs are our priority.  As part of our continuing mission to provide you with exceptional heart care, we have created designated Provider Care Teams.  These Care Teams include your primary Cardiologist (physician) and Advanced Practice Providers (APPs -  Physician Assistants and Nurse Practitioners) who all work together to provide you with the care you need, when you need it.  Your next appointment:   6 month(s)  The format for your next appointment:   In Person  Provider:   Brian Munley, MD    

## 2018-11-27 NOTE — Progress Notes (Signed)
Cardiology Office Note:    Date:  11/27/2018   ID:  Jeffrey Poole, DOB 13-Jul-1950, MRN [id number]  PCP:  Jeffrey Maw, MD  Cardiologist:  Jeffrey More, MD    Referring MD: Jeffrey Poole,*    ASSESSMENT:    1. Coronary artery disease of native artery of native heart with stable angina pectoris ([name])   2. Essential hypertension   3. Mixed hyperlipidemia   4. Enlarged thoracic aorta (HCC)    PLAN:    In order of problems listed above:  1. Stable New [place] Heart Association class I continue medical therapy including aspirin high intensity statin and nonrate limiting calcium channel blocker with resting bradycardia.  He is having no angina his personal preference is medical therapy I agree continue the same and reassess in 6 months or sooner if he is having frequent angina. 2. Stable BP at target continue current antihypertensives 3. Stable lipids are ideal continue his high intensity statin 4. Repeat CT July 2021   Next appointment: 6 months   Medication Adjustments/Labs and Tests Ordered: Current medicines are reviewed at length with the patient today.  Concerns regarding medicines are outlined above.  No orders of the defined types were placed in this encounter.  No orders of the defined types were placed in this encounter.   Chief Complaint  Patient presents with  . Follow-up  . Coronary Artery Disease    History of Present Illness:    Jeffrey Poole is a 68 y.o. male with a hx of CAD, HTN, enlarged thoracic aorta, HLD, GERD  last seen by Jeffrey Montana, NP 09/16/2018.  Of note is he has had evaluation in June with an echocardiogram shows EF of 55 to 60% mild LVH and mild to moderate dilatation of the ascending aorta.  Subsequent CTA showed severe LAD disease.  He declined left heart catheterization and intervention and elected medical treatment... Compliance with diet, lifestyle and medications: Yes  Is pleased with the quality of his life walks 3 miles  a day runs at times and has no anginal discomfort tolerates and is compliant with medications.  His recent lipid profile was ideal with a cholesterol of 108 HDL 43 LDL 57.  We discussed the options for treatment and I think at this time his CAD is stable he is on optimal medical therapy and is having no anginal discomfort.  I asked him to contact me if he is having chest pain that disrupts his life and follow-up in 6 months.  Had a repeat CTA and I told him I would wait at least a year before considering  Cardiac CTA 08/07/18 IMPRESSION: 1. Coronary calcium score of 167. This was 56th percentile for age and sex matched control. Calcium is seen in the LAD and RCA.  2. Right dominant circulation.  3. Severe calcified stenosis of the proximal to mid LAD (CADRADS 4) just proximal to the takeoff of the D1 vessel.  4. Sequential mild to moderate calcified, non-obstructive stenoses of the proximal to mid RCA (CADRADS3).  5. Borderline dilated aortic root at 3.9 cm.  5. Recommend cardiac catheterization.   Echo 07/03/18  1. The left ventricle has normal systolic function, with an ejection fraction of 55-60%. The cavity size was normal. There is mild concentric left ventricular hypertrophy. Left ventricular diastolic Doppler parameters are consistent with impaired  relaxation. 2. The right ventricle has normal systolic function. The cavity was normal. There is no increase in right ventricular wall thickness. 3. The  aortic root and aortic arch are normal in size and structure. 4. There is mild to moderate dilatation of the ascending aorta measuring 40 mm.  Past Medical History:  Diagnosis Date  . 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 Encounter for health maintenance examination with abnormal findings 05/07/2017  . Fibromyalgia 06/24/2018  . Gastroesophageal reflux disease 05/07/2017  . Hypertension 06/24/2018  . 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 Retinal detachment of right eye with multiple  breaks 02/08/2018  . Trigger finger of all digits of both hands 05/07/2017    Past Surgical History:  Procedure Laterality Date  . BACK SURGERY    . RETINAL DETACHMENT SURGERY      Current Medications: Current Meds  Medication Sig  . amLODipine (NORVASC) 10 MG tablet Take 1 tablet (10 mg total) by mouth daily.  Marland Kitchen aspirin EC 81 MG tablet Take 1 tablet (81 mg total) by mouth daily.  . hydrochlorothiazide (HYDRODIURIL) 25 MG tablet Take 1 tablet (25 mg total) by mouth daily.  . lansoprazole (PREVACID) 30 MG capsule Take 1 capsule by mouth once daily  . nitroGLYCERIN (NITROSTAT) 0.4 MG SL tablet Place 1 tablet (0.4 mg total) under the tongue every 5 (five) minutes as needed.  . rosuvastatin (CRESTOR) 5 MG tablet Take 1 tablet (5 mg total) by mouth daily at 6 PM.     Allergies:   Patient has no known allergies.   Social History   Socioeconomic History  . Marital status: Married    Spouse name: Not on file  . Number of children: Not on file  . Years of education: Not on file  . Highest education level: Not on file  Occupational History  . Not on file  Social Needs  . Financial resource strain: Not on file  . Food insecurity    Worry: Not on file    Inability: Not on file  . Transportation needs    Medical: Not on file    Non-medical: Not on file  Tobacco Use  . Smoking status: Never Smoker  . Smokeless tobacco: Never Used  Substance and Sexual Activity  . Alcohol use: Never    Frequency: Never  . Drug use: Never  . Sexual activity: Not on file  Lifestyle  . Physical activity    Days per week: Not on file    Minutes per session: Not on file  . Stress: Not on file  Relationships  . Social Herbalist on phone: Not on file    Gets together: Not on file    Attends religious service: Not on file    Active member of club or organization: Not on file    Attends meetings of clubs or organizations: Not on file    Relationship status: Not on file  Other Topics  Concern  . Not on file  Social History Narrative  . Not on file     Family History: The patient's family history includes Heart Problems in his maternal uncle; Hypertension in his brother and mother. ROS:   Please see the history of present illness.    All other systems reviewed and are negative.  EKGs/Labs/Other Studies Reviewed:    The following studies were reviewed today:  EKG:  EKG ordered 09/16/2018 demonstrates sinus rhythm normal EKG  Recent Labs: 06/25/2018: NT-Pro BNP 27 10/29/2018: ALT 64; BUN 15; Creatinine, Ser 1.03; Potassium 4.3; Sodium 141  Recent Lipid Panel    Component Value Date/Time   CHOL 108 10/29/2018 1456   TRIG 119  10/29/2018 1456   HDL 43 10/29/2018 1456   CHOLHDL 2.5 10/29/2018 1456   CHOLHDL 4 05/07/2017 1608   VLDL 26.0 05/07/2017 1608   LDLCALC 44 10/29/2018 1456   LDLDIRECT 57 09/16/2018 1502    Physical Exam:    VS:  BP 124/82 (BP Location: Right Arm, Patient Position: Sitting, Cuff Size: Large)   Pulse 60   Ht 6\' 3"  (1.905 m)   Wt 236 lb 6.4 oz (107.2 kg)   SpO2 97%   BMI 29.55 kg/m     Wt Readings from Last 3 Encounters:  11/27/18 236 lb 6.4 oz (107.2 kg)  09/16/18 249 lb (112.9 kg)  08/26/18 249 lb 1.9 oz (113 kg)     GEN:  Well nourished, well developed in no acute distress HEENT: Normal NECK: No JVD; No carotid bruits LYMPHATICS: No lymphadenopathy CARDIAC: RRR, no murmurs, rubs, gallops RESPIRATORY:  Clear to auscultation without rales, wheezing or rhonchi  ABDOMEN: Soft, non-tender, non-distended MUSCULOSKELETAL:  No edema; No deformity  SKIN: Warm and dry NEUROLOGIC:  Alert and oriented x 3 PSYCHIATRIC:  Normal affect    Signed, Jeffrey More, MD  11/27/2018 4:30 PM    [facility]

## 2018-12-09 ENCOUNTER — Other Ambulatory Visit: Payer: Self-pay | Admitting: Cardiology

## 2019-01-07 ENCOUNTER — Other Ambulatory Visit: Payer: Self-pay | Admitting: Family

## 2019-01-07 DIAGNOSIS — I10 Essential (primary) hypertension: Secondary | ICD-10-CM

## 2019-01-09 ENCOUNTER — Other Ambulatory Visit: Payer: Self-pay | Admitting: Family

## 2019-01-09 ENCOUNTER — Other Ambulatory Visit: Payer: Self-pay | Admitting: Family Medicine

## 2019-01-09 DIAGNOSIS — K219 Gastro-esophageal reflux disease without esophagitis: Secondary | ICD-10-CM

## 2019-01-09 DIAGNOSIS — I10 Essential (primary) hypertension: Secondary | ICD-10-CM

## 2019-01-09 NOTE — Telephone Encounter (Signed)
Forwarding to PCP.

## 2019-01-27 ENCOUNTER — Other Ambulatory Visit: Payer: Self-pay | Admitting: Family

## 2019-01-27 DIAGNOSIS — I10 Essential (primary) hypertension: Secondary | ICD-10-CM

## 2019-01-30 ENCOUNTER — Telehealth: Payer: Self-pay | Admitting: Cardiology

## 2019-01-30 NOTE — Telephone Encounter (Signed)
I spoke with patient who reports for the last 2-3 weeks he has been having pain on both sides of upper back. Below shoulder blades. States if feels like a tight muscle. Worse when he moves a certain way.Feels like it "catches". No chest pain. He reports history of fibromyalgia. Was on medications for this but no longer taking. He does not think pain is heart related but is wondering if it could be related to his BP medications.  I told patient amlodipine and HCTZ are probably not causing this pain.  Patient is planning to follow up with PCP

## 2019-01-30 NOTE — Telephone Encounter (Signed)
Patient would like to speak to a nurse.

## 2019-03-17 ENCOUNTER — Other Ambulatory Visit: Payer: Self-pay | Admitting: Cardiology

## 2019-03-17 DIAGNOSIS — I10 Essential (primary) hypertension: Secondary | ICD-10-CM

## 2019-05-08 DIAGNOSIS — L820 Inflamed seborrheic keratosis: Secondary | ICD-10-CM | POA: Diagnosis not present

## 2019-05-08 DIAGNOSIS — D485 Neoplasm of uncertain behavior of skin: Secondary | ICD-10-CM | POA: Diagnosis not present

## 2019-05-20 NOTE — Progress Notes (Signed)
Cardiology Office Note:    Date:  05/21/2019   ID:  Jeffrey Poole, DOB 1950/11/18, MRN [id number]  PCP:  Libby Maw, MD  Cardiologist:  Shirlee More, MD    Referring MD: Libby Maw,*    ASSESSMENT:    No diagnosis found. PLAN:    In order of problems listed above:  1. CAD stable having no angina New York Heart Association class I and so far has done well with his decision for medical treatment.  I told him unless he has a change in his clinical course having frequent angina or episodes at rest he will continue treatment including aspirin calcium channel blocker statin nitroglycerin as needed and follow-up in the office 6 months 2. BP at target continue thiazide diuretic calcium channel blocker 3. Continue his high intensity statin check LDL goal less than 100 ideally less than 70 and liver function. 4. Stable sinus bradycardia avoid rate slowing medications   Next appointment: 6 months   Medication Adjustments/Labs and Tests Ordered: Current medicines are reviewed at length with the patient today.  Concerns regarding medicines are outlined above.  No orders of the defined types were placed in this encounter.  No orders of the defined types were placed in this encounter.   Chief Complaint  Patient presents with  . Follow-up  . Coronary Artery Disease    History of Present Illness:    Jeffrey Poole is a 69 y.o. male with a hx of  CAD, HTN, enlarged thoracic aorta, HLD, GERD  seen by Laurann Montana, NP 09/16/2018.  Of note is he has had evaluation in June with an echocardiogram shows EF of 55 to 60% mild LVH and mild to moderate dilatation of the ascending aorta and 39 mm.  Subsequent CTA showed severe LAD disease.  He declined left heart catheterization and intervention and elected medical treatment...  He was last seen 11/27/2018 and advised follow-up CT thoracic aorta July 2021. Compliance with diet, lifestyle and medications: Yes  Remains very active  walks and jogs several times a week does vigorous gardening work and is not having exertional chest pain or shortness of breath.  He took nitroglycerin on 1 occasion 6 months ago when he had nocturnal chest pain.  He tolerates his statin without muscle pain or weakness we will recheck labs concluding a CMP lipid profile and he requested B12 level as his wife is quite deficient and he is concerned about himself Past Medical History:  Diagnosis Date  . 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 Encounter for health maintenance examination with abnormal findings 05/07/2017  . Fibromyalgia 06/24/2018  . Gastroesophageal reflux disease 05/07/2017  . Hypertension 06/24/2018  . 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 Palpitations 06/25/2018  . 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 Trigger finger of all digits of both hands 05/07/2017    Past Surgical History:  Procedure Laterality Date  . BACK SURGERY    . RETINAL DETACHMENT SURGERY      Current Medications: Current Meds  Medication Sig  . amLODipine (NORVASC) 10 MG tablet Take 1 tablet by mouth once daily  . aspirin EC 81 MG tablet Take 1 tablet (81 mg total) by mouth daily.  . hydrochlorothiazide (HYDRODIURIL) 25 MG tablet Take 1 tablet by mouth once daily  . lansoprazole (PREVACID) 30 MG capsule Take 1 capsule by mouth once daily  . nitroGLYCERIN (NITROSTAT) 0.4 MG SL tablet Place 1 tablet (0.4 mg total) under the tongue every 5 (five)  minutes as needed.  . rosuvastatin (CRESTOR) 5 MG tablet TAKE 1 TABLET BY MOUTH ONCE DAILY AT  6  P.M.     Allergies:   Patient has no known allergies.   Social History   Socioeconomic History  . Marital status: Married    Spouse name: Not on file  . Number of children: Not on file  . Years of education: Not on file  . Highest education level: Not on file  Occupational History  . Not on file  Tobacco Use  . Smoking status: Never Smoker  . Smokeless tobacco: Never Used  Substance and Sexual  Activity  . Alcohol use: Never  . Drug use: Never  . Sexual activity: Not on file  Other Topics Concern  . Not on file  Social History Narrative  . Not on file   Social Determinants of Health   Financial Resource Strain:   . Difficulty of Paying Living Expenses:   Food Insecurity:   . Worried About Charity fundraiser in the Last Year:   . Arboriculturist in the Last Year:   Transportation Needs:   . Film/video editor (Medical):   Marland Kitchen Lack of Transportation (Non-Medical):   Physical Activity:   . Days of Exercise per Week:   . Minutes of Exercise per Session:   Stress:   . Feeling of Stress :   Social Connections:   . Frequency of Communication with Friends and Family:   . Frequency of Social Gatherings with Friends and Family:   . Attends Religious Services:   . Active Member of Clubs or Organizations:   . Attends Archivist Meetings:   Marland Kitchen Marital Status:      Family History: The patient's family history includes Heart Problems in his maternal uncle; Hypertension in his brother and mother. ROS:   Please see the history of present illness.    All other systems reviewed and are negative.  EKGs/Labs/Other Studies Reviewed:    The following studies were reviewed today:  EKG:  EKG ordered today and personally reviewed.  The ekg ordered today demonstrates sinus tach cardia and is otherwise normal  Recent Labs: 06/25/2018: NT-Pro BNP 27 10/29/2018: ALT 64; BUN 15; Creatinine, Ser 1.03; Potassium 4.3; Sodium 141  Recent Lipid Panel    Component Value Date/Time   CHOL 108 10/29/2018 1456   TRIG 119 10/29/2018 1456   HDL 43 10/29/2018 1456   CHOLHDL 2.5 10/29/2018 1456   CHOLHDL 4 05/07/2017 1608   VLDL 26.0 05/07/2017 1608   LDLCALC 44 10/29/2018 1456   LDLDIRECT 57 09/16/2018 1502    Physical Exam:    VS:  BP 122/68   Pulse (!) 56   Temp 97.7 F (36.5 C)   Ht 6\' 3"  (1.905 m)   Wt 225 lb 1.9 oz (102.1 kg)   SpO2 96%   BMI 28.14 kg/m     Wt  Readings from Last 3 Encounters:  05/21/19 225 lb 1.9 oz (102.1 kg)  11/27/18 236 lb 6.4 oz (107.2 kg)  09/16/18 249 lb (112.9 kg)     GEN:  Well nourished, well developed in no acute distress HEENT: Normal NECK: No JVD; No carotid bruits LYMPHATICS: No lymphadenopathy CARDIAC: RRR, no murmurs, rubs, gallops RESPIRATORY:  Clear to auscultation without rales, wheezing or rhonchi  ABDOMEN: Soft, non-tender, non-distended MUSCULOSKELETAL:  No edema; No deformity  SKIN: Warm and dry NEUROLOGIC:  Alert and oriented x 3 PSYCHIATRIC:  Normal affect  Signed, Shirlee More, MD  05/21/2019 4:10 PM    [facility]

## 2019-05-21 ENCOUNTER — Other Ambulatory Visit: Payer: Self-pay

## 2019-05-21 ENCOUNTER — Ambulatory Visit (INDEPENDENT_AMBULATORY_CARE_PROVIDER_SITE_OTHER): Payer: Medicare Other | Admitting: Cardiology

## 2019-05-21 ENCOUNTER — Encounter: Payer: Self-pay | Admitting: Cardiology

## 2019-05-21 VITALS — BP 122/68 | HR 56 | Temp 97.7°F | Ht 75.0 in | Wt 225.1 lb

## 2019-05-21 DIAGNOSIS — R6889 Other general symptoms and signs: Secondary | ICD-10-CM | POA: Diagnosis not present

## 2019-05-21 DIAGNOSIS — I10 Essential (primary) hypertension: Secondary | ICD-10-CM

## 2019-05-21 DIAGNOSIS — E785 Hyperlipidemia, unspecified: Secondary | ICD-10-CM | POA: Diagnosis not present

## 2019-05-21 DIAGNOSIS — I2583 Coronary atherosclerosis due to lipid rich plaque: Secondary | ICD-10-CM | POA: Insufficient documentation

## 2019-05-21 DIAGNOSIS — R001 Bradycardia, unspecified: Secondary | ICD-10-CM | POA: Diagnosis not present

## 2019-05-21 DIAGNOSIS — I2510 Atherosclerotic heart disease of native coronary artery without angina pectoris: Secondary | ICD-10-CM | POA: Insufficient documentation

## 2019-05-21 NOTE — Patient Instructions (Signed)
Medication Instructions:  Your physician recommends that you continue on your current medications as directed. Please refer to the Current Medication list given to you today.  *If you need a refill on your cardiac medications before your next appointment, please call your pharmacy*   Lab Work: Your physician recommends that you return for lab work in: TODAY CMP, B12, Lipids If you have labs (blood work) drawn today and your tests are completely normal, you will receive your results only by: Marland Kitchen MyChart Message (if you have MyChart) OR . A paper copy in the mail If you have any lab test that is abnormal or we need to change your treatment, we will call you to review the results.   Testing/Procedures: None   Follow-Up: At [facility], you and your health needs are our priority.  As part of our continuing mission to provide you with exceptional heart care, we have created designated Provider Care Teams.  These Care Teams include your primary Cardiologist (physician) and Advanced Practice Providers (APPs -  Physician Assistants and Nurse Practitioners) who all work together to provide you with the care you need, when you need it.  We recommend signing up for the patient portal called "MyChart".  Sign up information is provided on this After Visit Summary.  MyChart is used to connect with patients for Virtual Visits (Telemedicine).  Patients are able to view lab/test results, encounter notes, upcoming appointments, etc.  Non-urgent messages can be sent to your provider as well.   To learn more about what you can do with MyChart, go to [email or web address].    Your next appointment:   6 month(s)  The format for your next appointment:   In Person  Provider:   Shirlee More, MD   Other Instructions

## 2019-05-22 ENCOUNTER — Telehealth: Payer: Self-pay

## 2019-05-22 DIAGNOSIS — H25812 Combined forms of age-related cataract, left eye: Secondary | ICD-10-CM | POA: Diagnosis not present

## 2019-05-22 DIAGNOSIS — H179 Unspecified corneal scar and opacity: Secondary | ICD-10-CM | POA: Diagnosis not present

## 2019-05-22 DIAGNOSIS — H5213 Myopia, bilateral: Secondary | ICD-10-CM | POA: Diagnosis not present

## 2019-05-22 DIAGNOSIS — Z961 Presence of intraocular lens: Secondary | ICD-10-CM | POA: Diagnosis not present

## 2019-05-22 DIAGNOSIS — H52223 Regular astigmatism, bilateral: Secondary | ICD-10-CM | POA: Diagnosis not present

## 2019-05-22 DIAGNOSIS — Z9841 Cataract extraction status, right eye: Secondary | ICD-10-CM | POA: Diagnosis not present

## 2019-05-22 DIAGNOSIS — H524 Presbyopia: Secondary | ICD-10-CM | POA: Diagnosis not present

## 2019-05-22 DIAGNOSIS — H59811 Chorioretinal scars after surgery for detachment, right eye: Secondary | ICD-10-CM | POA: Diagnosis not present

## 2019-05-22 LAB — COMPREHENSIVE METABOLIC PANEL
ALT: 18 IU/L (ref 0–44)
AST: 22 IU/L (ref 0–40)
Albumin/Globulin Ratio: 1.6 (ref 1.2–2.2)
Albumin: 4.6 g/dL (ref 3.8–4.8)
Alkaline Phosphatase: 83 IU/L (ref 39–117)
BUN/Creatinine Ratio: 12 (ref 10–24)
BUN: 12 mg/dL (ref 8–27)
Bilirubin Total: 0.5 mg/dL (ref 0.0–1.2)
CO2: 23 mmol/L (ref 20–29)
Calcium: 9.7 mg/dL (ref 8.6–10.2)
Chloride: 105 mmol/L (ref 96–106)
Creatinine, Ser: 1 mg/dL (ref 0.76–1.27)
GFR calc Af Amer: 89 mL/min/{1.73_m2} (ref >59)
GFR calc non Af Amer: 77 mL/min/{1.73_m2} (ref >59)
Globulin, Total: 2.8 g/dL (ref 1.5–4.5)
Glucose: 97 mg/dL (ref 65–99)
Potassium: 4.7 mmol/L (ref 3.5–5.2)
Sodium: 143 mmol/L (ref 134–144)
Total Protein: 7.4 g/dL (ref 6.0–8.5)

## 2019-05-22 LAB — LIPID PANEL
Chol/HDL Ratio: 2.5 ratio (ref 0.0–5.0)
Cholesterol, Total: 119 mg/dL (ref 100–199)
HDL: 48 mg/dL (ref >39)
LDL Chol Calc (NIH): 54 mg/dL (ref 0–99)
Triglycerides: 87 mg/dL (ref 0–149)
VLDL Cholesterol Cal: 17 mg/dL (ref 5–40)

## 2019-05-22 LAB — VITAMIN B12: Vitamin B-12: 248 pg/mL (ref 232–1245)

## 2019-05-22 NOTE — Telephone Encounter (Signed)
-----   Message from Richardo Priest, MD sent at 05/22/2019  8:18 AM EDT ----- Normal or stable result  All are good, and his B12 level is at the lower limits of normal and he might want to start over-the-counter B12 and that they have a sublingual around that time preparation is better absorbed

## 2019-05-22 NOTE — Telephone Encounter (Signed)
Spoke with patient regarding results and recommendation.  Patient verbalizes understanding and is agreeable to plan of care. Advised patient to call back with any issues or concerns.  

## 2019-07-04 ENCOUNTER — Other Ambulatory Visit: Payer: Self-pay | Admitting: Family Medicine

## 2019-07-04 DIAGNOSIS — K219 Gastro-esophageal reflux disease without esophagitis: Secondary | ICD-10-CM

## 2019-07-14 ENCOUNTER — Telehealth: Payer: Self-pay | Admitting: Cardiology

## 2019-07-14 NOTE — Telephone Encounter (Signed)
Spoke to the patient just now and I let him know that cardiac CT's could only be done at [facility] or [facility] in [place]. He verbalizes understanding of this and states that he will leave it to be done at [facility] in [place].    Encouraged patient to call back with any questions or concerns.

## 2019-07-14 NOTE — Telephone Encounter (Signed)
Jeffrey Poole is calling to schedule his CT to be done in [place]. Please advise.

## 2019-07-31 ENCOUNTER — Telehealth: Payer: Self-pay | Admitting: Cardiology

## 2019-07-31 NOTE — Telephone Encounter (Signed)
New message     Pt hung up

## 2019-07-31 NOTE — Telephone Encounter (Signed)
New Message:    Pt said had a heat exhaustion about 4 days ago. He says he have not felt right since that time and some chest pain off and on. No chest pain at this time.. Pt wanted to be seen. I made him an appt for tomorrow.

## 2019-08-01 ENCOUNTER — Encounter: Payer: Self-pay | Admitting: Cardiology

## 2019-08-01 ENCOUNTER — Other Ambulatory Visit: Payer: Self-pay

## 2019-08-01 ENCOUNTER — Emergency Department (HOSPITAL_BASED_OUTPATIENT_CLINIC_OR_DEPARTMENT_OTHER)
Admission: EM | Admit: 2019-08-01 | Discharge: 2019-08-01 | Disposition: A | Discharge status: Home / Self Care | Payer: Medicare Other | Attending: Emergency Medicine | Admitting: Emergency Medicine

## 2019-08-01 ENCOUNTER — Encounter (HOSPITAL_BASED_OUTPATIENT_CLINIC_OR_DEPARTMENT_OTHER): Payer: Self-pay | Admitting: *Deleted

## 2019-08-01 ENCOUNTER — Ambulatory Visit (INDEPENDENT_AMBULATORY_CARE_PROVIDER_SITE_OTHER): Payer: Medicare Other | Admitting: Cardiology

## 2019-08-01 ENCOUNTER — Emergency Department (HOSPITAL_BASED_OUTPATIENT_CLINIC_OR_DEPARTMENT_OTHER): Payer: Medicare Other

## 2019-08-01 VITALS — BP 126/70 | HR 70 | Ht 75.0 in | Wt 223.4 lb

## 2019-08-01 DIAGNOSIS — I2510 Atherosclerotic heart disease of native coronary artery without angina pectoris: Secondary | ICD-10-CM | POA: Insufficient documentation

## 2019-08-01 DIAGNOSIS — K5730 Diverticulosis of large intestine without perforation or abscess without bleeding: Secondary | ICD-10-CM | POA: Insufficient documentation

## 2019-08-01 DIAGNOSIS — I10 Essential (primary) hypertension: Secondary | ICD-10-CM | POA: Insufficient documentation

## 2019-08-01 DIAGNOSIS — I25118 Atherosclerotic heart disease of native coronary artery with other forms of angina pectoris: Secondary | ICD-10-CM | POA: Diagnosis not present

## 2019-08-01 DIAGNOSIS — Z79899 Other long term (current) drug therapy: Secondary | ICD-10-CM | POA: Diagnosis not present

## 2019-08-01 DIAGNOSIS — I7789 Other specified disorders of arteries and arterioles: Secondary | ICD-10-CM | POA: Diagnosis not present

## 2019-08-01 DIAGNOSIS — R55 Syncope and collapse: Secondary | ICD-10-CM | POA: Diagnosis not present

## 2019-08-01 DIAGNOSIS — Z7982 Long term (current) use of aspirin: Secondary | ICD-10-CM | POA: Insufficient documentation

## 2019-08-01 DIAGNOSIS — D3500 Benign neoplasm of unspecified adrenal gland: Secondary | ICD-10-CM | POA: Diagnosis not present

## 2019-08-01 DIAGNOSIS — E785 Hyperlipidemia, unspecified: Secondary | ICD-10-CM | POA: Diagnosis not present

## 2019-08-01 DIAGNOSIS — M47816 Spondylosis without myelopathy or radiculopathy, lumbar region: Secondary | ICD-10-CM | POA: Diagnosis not present

## 2019-08-01 DIAGNOSIS — I700 Atherosclerosis of aorta: Secondary | ICD-10-CM | POA: Diagnosis not present

## 2019-08-01 DIAGNOSIS — I359 Nonrheumatic aortic valve disorder, unspecified: Secondary | ICD-10-CM | POA: Diagnosis not present

## 2019-08-01 HISTORY — DX: Atherosclerotic heart disease of native coronary artery without angina pectoris: I25.10

## 2019-08-01 LAB — CBC WITH DIFFERENTIAL/PLATELET
Abs Immature Granulocytes: 0.02 10*3/uL (ref 0.00–0.07)
Basophils Absolute: 0.1 10*3/uL (ref 0.0–0.1)
Basophils Relative: 1 %
Eosinophils Absolute: 0.1 10*3/uL (ref 0.0–0.5)
Eosinophils Relative: 1 %
HCT: 49 % (ref 39.0–52.0)
Hemoglobin: 16.2 g/dL (ref 13.0–17.0)
Immature Granulocytes: 0 %
Lymphocytes Relative: 32 %
Lymphs Abs: 2.2 10*3/uL (ref 0.7–4.0)
MCH: 27.6 pg (ref 26.0–34.0)
MCHC: 33.1 g/dL (ref 30.0–36.0)
MCV: 83.6 fL (ref 80.0–100.0)
Monocytes Absolute: 0.8 10*3/uL (ref 0.1–1.0)
Monocytes Relative: 11 %
Neutro Abs: 3.8 10*3/uL (ref 1.7–7.7)
Neutrophils Relative %: 55 %
Platelets: 209 10*3/uL (ref 150–400)
RBC: 5.86 MIL/uL — ABNORMAL HIGH (ref 4.22–5.81)
RDW: 14.2 % (ref 11.5–15.5)
WBC: 7 10*3/uL (ref 4.0–10.5)
nRBC: 0 % (ref 0.0–0.2)

## 2019-08-01 LAB — COMPREHENSIVE METABOLIC PANEL
ALT: 23 U/L (ref 0–44)
AST: 26 U/L (ref 15–41)
Albumin: 4.4 g/dL (ref 3.5–5.0)
Alkaline Phosphatase: 75 U/L (ref 38–126)
Anion gap: 9 (ref 5–15)
BUN: 12 mg/dL (ref 8–23)
CO2: 27 mmol/L (ref 22–32)
Calcium: 9.4 mg/dL (ref 8.9–10.3)
Chloride: 106 mmol/L (ref 98–111)
Creatinine, Ser: 0.94 mg/dL (ref 0.61–1.24)
GFR calc Af Amer: >60 mL/min (ref >60)
GFR calc non Af Amer: >60 mL/min (ref >60)
Glucose, Bld: 109 mg/dL — ABNORMAL HIGH (ref 70–99)
Potassium: 4.1 mmol/L (ref 3.5–5.1)
Sodium: 142 mmol/L (ref 135–145)
Total Bilirubin: 0.9 mg/dL (ref 0.3–1.2)
Total Protein: 7.8 g/dL (ref 6.5–8.1)

## 2019-08-01 LAB — TROPONIN I (HIGH SENSITIVITY): Troponin I (High Sensitivity): 8 ng/L (ref <18)

## 2019-08-01 MED ORDER — IOHEXOL 350 MG/ML SOLN
100.0000 mL | Freq: Once | INTRAVENOUS | Status: AC | PRN
  Administered 2019-08-01: 100 mL via INTRAVENOUS

## 2019-08-01 NOTE — ED Provider Notes (Signed)
[facility] EMERGENCY DEPARTMENT Provider Note   CSN: [id number] Arrival date & time: 08/01/19  1604     History Chief Complaint  Patient presents with  . Near Syncope    Jeffrey Poole is a 69 y.o. male.  69 yo M with chief complaints of a syncopal event.  This happened 3 days ago.  The patient went for a run.  States that he typically runs and alternates walking for about 3 miles at a time.  Does this a few times a week.  He neglected to have much to eat or drink before he went and after he finished he sat on a bench and suddenly felt like he could see spots and felt he was in a pass out.  He laid down on the ground and felt weak for some period of time.  Eventually was able to get off and make it back to his car and then was able to drive home.  He thinks he was just dehydrated.  Has had no symptoms since then.  Has chronic left-sided headaches that he has continued to have off and on but without significant change.  Denies any chest pain or shortness of breath during his exercise or during the event.  Denies any significant change in his headache or severe headache with his syncopal events either.  Saw his cardiologist today in the office.  They are concerned for a primary cardiac ischemia as he had had a recent coronary CT that was concerning for LAD stenosis.  Recommended getting a troponin.  He also has a history of a dilated thoracic aorta and they would like to have this checked again as well.  The history is provided by the patient.  Near Syncope This is a new problem. The current episode started more than 2 days ago. The problem has been resolved. Associated symptoms include headaches (chronic and unchanged). Pertinent negatives include no chest pain, no abdominal pain and no shortness of breath. Nothing aggravates the symptoms. Nothing relieves the symptoms. He has tried nothing for the symptoms. The treatment provided no relief.       Past Medical History:  Diagnosis  Date  . 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 Coronary artery disease   . Encounter for health maintenance examination with abnormal findings 05/07/2017  . Fibromyalgia 06/24/2018  . Gastroesophageal reflux disease 05/07/2017  . Hypertension 06/24/2018  . 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 Palpitations 06/25/2018  . 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 Trigger finger of all digits of both hands 05/07/2017    Patient Active Problem List   Diagnosis Date Noted  . CAD in native artery 05/21/2019  . Palpitations 06/25/2018  . Fibromyalgia 06/24/2018  . Hypertension 06/24/2018  . 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 Trigger finger of all digits of both hands 05/07/2017  . Gastroesophageal reflux disease 05/07/2017  . Encounter for health maintenance examination with abnormal findings 05/07/2017  . 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 BACK SURGERY    . RETINAL DETACHMENT SURGERY         Family History  Problem Relation Age of Onset  . Hypertension Mother   . Hypertension Brother   . Heart Problems Maternal Uncle     Social History   Tobacco Use  . Smoking status: Never Smoker  . Smokeless tobacco: Never Used  Vaping Use  . Vaping  Use: Never used  Substance Use Topics  . Alcohol use: Never  . Drug use: Never    Home Medications Prior to Admission medications   Medication Sig Start Date End Date Taking? Authorizing Provider  amLODipine (NORVASC) 10 MG tablet Take 1 tablet by mouth once daily 01/28/19   Loel Dubonnet, NP  aspirin EC 81 MG tablet Take 1 tablet (81 mg total) by mouth daily. 08/09/18   Richardo Priest, MD  hydrochlorothiazide (HYDRODIURIL) 25 MG tablet Take 1 tablet by mouth once daily 03/18/19   Richardo Priest, MD  lansoprazole (PREVACID) 30 MG capsule Take 1 capsule by mouth once daily 01/09/19   Libby Maw, MD    nitroGLYCERIN (NITROSTAT) 0.4 MG SL tablet Place 1 tablet (0.4 mg total) under the tongue every 5 (five) minutes as needed. 08/14/18 11/26/24  Richardo Priest, MD  rosuvastatin (CRESTOR) 5 MG tablet TAKE 1 TABLET BY MOUTH ONCE DAILY AT  6  P.M. 12/10/18   Richardo Priest, MD    Allergies    Patient has no known allergies.  Review of Systems   Review of Systems  Constitutional: Negative for chills and fever.  HENT: Negative for congestion and facial swelling.   Eyes: Negative for discharge and visual disturbance.  Respiratory: Negative for shortness of breath.   Cardiovascular: Positive for near-syncope. Negative for chest pain and palpitations.  Gastrointestinal: Negative for abdominal pain, diarrhea and vomiting.  Musculoskeletal: Negative for arthralgias and myalgias.  Skin: Negative for color change and rash.  Neurological: Positive for syncope and headaches (chronic and unchanged). Negative for tremors.  Psychiatric/Behavioral: Negative for confusion and dysphoric mood.    Physical Exam Updated Vital Signs BP 139/77 (BP Location: Left Arm)   Pulse (!) 55   Resp 18   Ht 6\' 2"  (1.88 m)   Wt 99.8 kg   SpO2 100%   BMI 28.25 kg/m   Physical Exam Vitals and nursing note reviewed.  Constitutional:      Appearance: He is well-developed.  HENT:     Head: Normocephalic and atraumatic.  Eyes:     Pupils: Pupils are equal, round, and reactive to light.  Neck:     Vascular: No JVD.  Cardiovascular:     Rate and Rhythm: Normal rate and regular rhythm.     Heart sounds: No murmur heard.  No friction rub. No gallop.   Pulmonary:     Effort: No respiratory distress.     Breath sounds: No wheezing.  Abdominal:     General: There is no distension.     Tenderness: There is no abdominal tenderness. There is no guarding or rebound.  Musculoskeletal:        General: Normal range of motion.     Cervical back: Normal range of motion and neck supple.  Skin:    Coloration: Skin is  not pale.     Findings: No rash.  Neurological:     Mental Status: He is alert and oriented to person, place, and time.  Psychiatric:        Behavior: Behavior normal.     ED Results / Procedures / Treatments   Labs (all labs ordered are listed, but only abnormal results are displayed) Labs Reviewed  CBC WITH DIFFERENTIAL/PLATELET - Abnormal; Notable for the following components:      Result Value   RBC 5.86 (*)    All other components within normal limits  COMPREHENSIVE METABOLIC PANEL - Abnormal; Notable for the following  components:   Glucose, Bld 109 (*)    All other components within normal limits  TROPONIN I (HIGH SENSITIVITY)    EKG EKG Interpretation  Date/Time:  Friday August 01 2019 16:21:39 EDT Ventricular Rate:  54 PR Interval:  184 QRS Duration: 84 QT Interval:  424 QTC Calculation: 402 R Axis:   3 Text Interpretation: Sinus bradycardia Cannot rule out Anterior infarct , age undetermined Abnormal ECG No old tracing to compare Confirmed by Deno Etienne [phone]) on 08/01/2019 5:56:36 PM   Radiology CT Angio Chest/Abd/Pel for Dissection W and/or Wo Contrast  Result Date: 08/01/2019 CLINICAL DATA:  Aortic disease, nontraumatic syncopy , r/o dissection EXAM: CT ANGIOGRAPHY CHEST, ABDOMEN AND PELVIS TECHNIQUE: Non-contrast CT of the chest was initially obtained. Multidetector CT imaging through the chest, abdomen and pelvis was performed using the standard protocol during bolus administration of intravenous contrast. Multiplanar reconstructed images and MIPs were obtained and reviewed to evaluate the vascular anatomy. CONTRAST:  177mL OMNIPAQUE IOHEXOL 350 MG/ML SOLN COMPARISON:  Included portion from coronary CT 08/07/2018. FINDINGS: CTA CHEST FINDINGS Cardiovascular: Noncontrast exam demonstrates no aortic hematoma. No aortic aneurysm. There is no aortic dissection or evidence of acute aortic syndrome. Mild cardiac motion partially obscures the ascending aorta. Minor aortic  atherosclerosis. Conventional branching pattern from the aortic arch. There are coronary artery calcifications. No filling defects in the central pulmonary arteries. No pericardial effusion. Heart is normal in size. Mediastinum/Nodes: No mediastinal or hilar adenopathy. No visualized thyroid nodule. No esophageal wall thickening. Lungs/Pleura: The lungs are clear. There is no focal airspace disease. No pleural effusion or findings of pulmonary edema. Trachea and central bronchi are patent. No pulmonary mass or suspicious nodule. Musculoskeletal: Mild diffuse degenerative change throughout the spine. There are no acute or suspicious osseous abnormalities. Review of the MIP images confirms the above findings. CTA ABDOMEN AND PELVIS FINDINGS VASCULAR Aorta: Atherosclerosis without aneurysm. No dissection or acute aortic findings. No significant stenosis. Celiac: Patent without evidence of aneurysm, dissection, vasculitis or significant stenosis. SMA: Patent without evidence of aneurysm, dissection, vasculitis or significant stenosis. Replaced right hepatic artery arises from the SMA. Renals: 2 codominant right renal arteries. Single left renal artery patent. All renal arteries are patent without dissection or acute findings. No significant stenosis. IMA: Patent without evidence of aneurysm, dissection, vasculitis or significant stenosis. Inflow: Tortuous with atherosclerosis. No aneurysm, dissection, or acute findings. No significant stenosis. Veins: No obvious venous abnormality within the limitations of this arterial phase study. Review of the MIP images confirms the above findings. NON-VASCULAR Hepatobiliary: Small hypodensity in the left lobe of the liver is incompletely characterized on this exam but likely small cyst. Gallbladder is unremarkable. No calcified gallstone. Pancreas: No ductal dilatation or inflammation. Spleen: Normal in size without focal abnormality. Adrenals/Urinary Tract: 19 mm low-density left  adrenal nodule. This has Hounsfield units of 4 on noncontrast exam and is consistent with adenoma. Right adrenal gland is normal. No hydronephrosis or perinephric edema. There is homogeneous renal enhancement. Urinary bladder is partially distended. No bladder wall thickening. Stomach/Bowel: The stomach is unremarkable. There is no small bowel obstruction or inflammatory change. Normal appendix courses into the central pelvis. Moderate volume of stool throughout the colon. There is no colonic wall thickening. Mild distal diverticulosis without diverticulitis. Small volume of stool in the rectum. Lymphatic: No enlarged lymph nodes in the abdomen or pelvis. Reproductive: Prostate is unremarkable. Other: No ascites or free air. Tiny fat containing umbilical hernia. Minimal fat in the left inguinal canal. Musculoskeletal:  Multilevel degenerative change in the lumbar spine. Diffuse degenerative disc disease and facet arthropathy. There are no acute or suspicious osseous abnormalities. Review of the MIP images confirms the above findings. IMPRESSION: 1. No aortic dissection or acute aortic abnormality. 2. No acute abnormality in the chest, abdomen, or pelvis. 3. Left adrenal adenoma. 4. Mild distal colonic diverticulosis without diverticulitis. Moderate diffuse colonic stool burden can be seen with constipation. Aortic Atherosclerosis (ICD10-I70.0). Electronically Signed   By: Keith Rake M.D.   On: 08/01/2019 18:03    Procedures Procedures (including critical care time)  Medications Ordered in ED Medications  iohexol (OMNIPAQUE) 350 MG/ML injection 100 mL (100 mLs Intravenous Contrast Given 08/01/19 1718)    ED Course  I have reviewed the triage vital signs and the nursing notes.  Pertinent labs & imaging results that were available during my care of the patient were reviewed by me and considered in my medical decision making (see chart for details).    MDM Rules/Calculators/A&P                           69 yo M with a chief complaint of a syncopal event.  This happened 4 days ago.  Has had no recurrent issues since then.  Occurred after exercising when he had little to eat and drink.  Could have been vasovagal as well.  Sent by his cardiologist for lab testing and imaging.  CT angiogram of the chest without significant dilatation of the aorta.  His troponin is negative.  Discharge home.  6:28 PM:  I have discussed the diagnosis/risks/treatment options with the patient and believe the pt to be eligible for discharge home to follow-up with PCP, cards. We also discussed returning to the ED immediately if new or worsening sx occur. We discussed the sx which are most concerning (e.g., sudden worsening pain, fever, inability to tolerate by mouth, recurrent event) that necessitate immediate return. Medications administered to the patient during their visit and any new prescriptions provided to the patient are listed below.  Medications given during this visit Medications  iohexol (OMNIPAQUE) 350 MG/ML injection 100 mL (100 mLs Intravenous Contrast Given 08/01/19 1718)     The patient appears reasonably screen and/or stabilized for discharge and I doubt any other medical condition or other [facility] requiring further screening, evaluation, or treatment in the ED at this time prior to discharge.     Final Clinical Impression(s) / ED Diagnoses Final diagnoses:  Syncope and collapse    Rx / DC Orders ED Discharge Orders    None       Deno Etienne, DO 08/01/19 1829

## 2019-08-01 NOTE — Discharge Instructions (Signed)
Eat and drink well for the next couple days.  Follow up with your PCP and cardiologist.  Return for repeat event or if you have chest pain or shortness of breath.

## 2019-08-01 NOTE — ED Triage Notes (Signed)
Pt sent here by cardiology for eval , near syncopal episode x 4 days ago. Pt states no complaints today.

## 2019-08-01 NOTE — Progress Notes (Signed)
Cardiology Office Note:    Date:  08/01/2019   ID:  Jeffrey Poole, DOB [birth year], MRN [id number]  PCP:  Libby Maw, MD  Cardiologist:  Shirlee More, MD    Referring MD: Libby Maw,*    ASSESSMENT:    1. Coronary artery disease of native artery of native heart with stable angina pectoris ([place])   2. Essential hypertension   3. Hyperlipidemia, unspecified hyperlipidemia type   4. Enlarged thoracic aorta (HCC)    PLAN:    In order of problems listed above:  1. His episode is quite disturbing best described as postexertional near syncope in the setting of known CAD with a cardiac CTA showing severe LAD stenosis.  He has had some chest discomfort I think when he needs an ischemia evaluation including a high-sensitivity troponin.  If abnormal I would treat him as acute coronary syndrome.  If normal I will make arrangements bring him back to the office and I think that we should plan outpatient elective coronary angiography. 2. Stable BP at target avoiding beta-blockers with bradycardia 3. Stable continue statin 4. I would also reimage his chest CTA if I stent sensitivity troponin is normal   Next appointment: Depending on results we can have him back in the office next week and follow-up   Medication Adjustments/Labs and Tests Ordered: Current medicines are reviewed at length with the patient today.  Concerns regarding medicines are outlined above.  No orders of the defined types were placed in this encounter.  No orders of the defined types were placed in this encounter.   Chief Complaint  Patient presents with  . Follow-up  . Coronary Artery Disease    History of Present Illness:    Jeffrey Poole is a 69 y.o. male with a hx of  CAD, HTN, enlarged thoracic aorta, HLD, GERD  seen by Laurann Montana, NP 09/16/2018.  Of note is he has had evaluation in June with an echocardiogram shows EF of 55 to 60% mild LVH and mild to moderate dilatation of the ascending  aorta and 39 mm.  Subsequent CTA showed severe LAD disease. He declined left heart catheterization and intervention and elected medical treatment.  He was last seen 05/21/2019. Compliance with diet, lifestyle and medications: Yes  4 days ago he went for his run in the afternoon and hot humid weather he thinks he was in the 90s.  After he finished exercising became very weak nauseous fell to the ground did not completely lose consciousness and he said it took about 30 minutes before he had the strength to get up.  Afterwards he felt washed out and nauseous had lasted for a few days and 1 to 2 days later he had vague chest tightness not severe not persistent never took a nitroglycerin.  All this is made him unsettled and he called the office and asked to be seen today.  His EKG shows sinus bradycardia 53 bpm no acute ischemic changes unchanged from May Past Medical History:  Diagnosis Date  . 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 Encounter for health maintenance examination with abnormal findings 05/07/2017  . Fibromyalgia 06/24/2018  . Gastroesophageal reflux disease 05/07/2017  . Hypertension 06/24/2018  . 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 Palpitations 06/25/2018  . 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 Trigger finger of all digits of both hands 05/07/2017    Past Surgical History:  Procedure Laterality Date  . BACK SURGERY    . RETINAL DETACHMENT SURGERY  Current Medications: Current Meds  Medication Sig  . amLODipine (NORVASC) 10 MG tablet Take 1 tablet by mouth once daily  . aspirin EC 81 MG tablet Take 1 tablet (81 mg total) by mouth daily.  . hydrochlorothiazide (HYDRODIURIL) 25 MG tablet Take 1 tablet by mouth once daily  . lansoprazole (PREVACID) 30 MG capsule Take 1 capsule by mouth once daily  . nitroGLYCERIN (NITROSTAT) 0.4 MG SL tablet Place 1 tablet (0.4 mg total) under the tongue every 5 (five) minutes as needed.  . rosuvastatin (CRESTOR) 5 MG tablet TAKE 1  TABLET BY MOUTH ONCE DAILY AT  6  P.M.     Allergies:   Patient has no known allergies.   Social History   Socioeconomic History  . Marital status: Married    Spouse name: Not on file  . Number of children: Not on file  . Years of education: Not on file  . Highest education level: Not on file  Occupational History  . Not on file  Tobacco Use  . Smoking status: Never Smoker  . Smokeless tobacco: Never Used  Vaping Use  . Vaping Use: Never used  Substance and Sexual Activity  . Alcohol use: Never  . Drug use: Never  . Sexual activity: Not on file  Other Topics Concern  . Not on file  Social History Narrative  . Not on file   Social Determinants of Health   Financial Resource Strain:   . Difficulty of Paying Living Expenses:   Food Insecurity:   . Worried About Charity fundraiser in the Last Year:   . Arboriculturist in the Last Year:   Transportation Needs:   . Film/video editor (Medical):   Marland Kitchen Lack of Transportation (Non-Medical):   Physical Activity:   . Days of Exercise per Week:   . Minutes of Exercise per Session:   Stress:   . Feeling of Stress :   Social Connections:   . Frequency of Communication with Friends and Family:   . Frequency of Social Gatherings with Friends and Family:   . Attends Religious Services:   . Active Member of Clubs or Organizations:   . Attends Archivist Meetings:   Marland Kitchen Marital Status:      Family History: The patient's family history includes Heart Problems in his maternal uncle; Hypertension in his brother and mother. ROS:   Please see the history of present illness.    All other systems reviewed and are negative.  EKGs/Labs/Other Studies Reviewed:    The following studies were reviewed today:    Recent Labs: 05/21/2019: ALT 18; BUN 12; Creatinine, Ser 1.00; Potassium 4.7; Sodium 143  Recent Lipid Panel    Component Value Date/Time   CHOL 119 05/21/2019 1626   TRIG 87 05/21/2019 1626   HDL 48 05/21/2019  1626   CHOLHDL 2.5 05/21/2019 1626   CHOLHDL 4 05/07/2017 1608   VLDL 26.0 05/07/2017 1608   LDLCALC 54 05/21/2019 1626   LDLDIRECT 57 09/16/2018 1502    Physical Exam:    VS:  BP 126/70   Pulse 70   Ht 6\' 3"  (1.905 m)   Wt 223 lb 6.4 oz (101.3 kg)   SpO2 97%   BMI 27.92 kg/m     Wt Readings from Last 3 Encounters:  08/01/19 223 lb 6.4 oz (101.3 kg)  05/21/19 225 lb 1.9 oz (102.1 kg)  11/27/18 236 lb 6.4 oz (107.2 kg)     GEN:  Well  nourished, well developed in no acute distress HEENT: Normal NECK: No JVD; No carotid bruits LYMPHATICS: No lymphadenopathy CARDIAC: RRR, no murmurs, rubs, gallops RESPIRATORY:  Clear to auscultation without rales, wheezing or rhonchi  ABDOMEN: Soft, non-tender, non-distended MUSCULOSKELETAL:  No edema; No deformity  SKIN: Warm and dry NEUROLOGIC:  Alert and oriented x 3 PSYCHIATRIC:  Normal affect    Signed, Shirlee More, MD  08/01/2019 3:43 PM    [facility]

## 2019-08-01 NOTE — Patient Instructions (Signed)
Medication Instructions:  Your physician recommends that you continue on your current medications as directed. Please refer to the Current Medication list given to you today.  *If you need a refill on your cardiac medications before your next appointment, please call your pharmacy*   Lab Work: None If you have labs (blood work) drawn today and your tests are completely normal, you will receive your results only by: Marland Kitchen MyChart Message (if you have MyChart) OR . A paper copy in the mail If you have any lab test that is abnormal or we need to change your treatment, we will call you to review the results.   Testing/Procedures: None   Follow-Up: At [facility], you and your health needs are our priority.  As part of our continuing mission to provide you with exceptional heart care, we have created designated Provider Care Teams.  These Care Teams include your primary Cardiologist (physician) and Advanced Practice Providers (APPs -  Physician Assistants and Nurse Practitioners) who all work together to provide you with the care you need, when you need it.  We recommend signing up for the patient portal called "MyChart".  Sign up information is provided on this After Visit Summary.  MyChart is used to connect with patients for Virtual Visits (Telemedicine).  Patients are able to view lab/test results, encounter notes, upcoming appointments, etc.  Non-urgent messages can be sent to your provider as well.   To learn more about what you can do with MyChart, go to [email or web address].    Your next appointment:   Please call us for a follow up appointment once you have left the hospital and they tell you their recommended follow up time.   The format for your next appointment:   In Person  Provider:   Shirlee More, MD   Other Instructions

## 2019-08-01 NOTE — ED Notes (Signed)
Patient transported to CT 

## 2019-08-05 ENCOUNTER — Telehealth: Payer: Self-pay | Admitting: Cardiology

## 2019-08-05 NOTE — Telephone Encounter (Signed)
Labs are good I think he needs undergo heart catheterization if he would like to I can schedule if not he should be set up for follow-up in the office with one of the physicians at [facility].

## 2019-08-05 NOTE — Telephone Encounter (Signed)
Patient states he is requesting to discuss results from lab work completed on 08/01/19. Please call.

## 2019-08-05 NOTE — Telephone Encounter (Signed)
Spoke with the patient just now. He let me know that he is not interested in having a heart catheterization at this time but did want to schedule a follow up with Dr. Bettina Gavia in [place]. I got him scheduled for 08/29/19 as this was the soonest we had available. Patient verbalizes understanding and does not have any other issues or concerns at this time.    Encouraged patient to call back with any questions or concerns.

## 2019-08-28 NOTE — Progress Notes (Signed)
Cardiology Office Note:    Date:  08/29/2019   ID:  Jeffrey Poole, DOB 08-23-1950, MRN [id number]  PCP:  Libby Maw, MD  Cardiologist:  Shirlee More, MD    Referring MD: Libby Maw,*    ASSESSMENT:    1. Coronary artery disease of native artery of native heart with stable angina pectoris ([place])   2. Essential hypertension   3. Mixed hyperlipidemia   4. Enlarged thoracic aorta (HCC)    PLAN:    In order of problems listed above:  1. Stable he had a high risk cardiac CTA and continues to decline coronary angiography.  I will see him back in my office in 3 months and he agrees that if he is having recurrent chest pain or exertional post exertional syncope he will reconsider.  He is not on a beta-blocker due to resting bradycardia he has discontinued his calcium channel blocker. 2. BP at target continue current treatment he will follow blood pressure at home if an additional agent is needed I will place him back on low-dose amlodipine. 3. Stable continue statin lipids at target 4. Stable no evidence of aortopathy and recent emergency room CTA   Next appointment: 3 months   Medication Adjustments/Labs and Tests Ordered: Current medicines are reviewed at length with the patient today.  Concerns regarding medicines are outlined above.  No orders of the defined types were placed in this encounter.  Meds ordered this encounter  Medications  . rosuvastatin (CRESTOR) 5 MG tablet    Sig: TAKE 1 TABLET BY MOUTH ONCE DAILY AT  6  P.M.    Dispense:  90 tablet    Refill:  1    Chief Complaint  Patient presents with  . Follow-up  . Coronary Artery Disease    History of Present Illness:    Jeffrey Poole is a 69 y.o. male with a hx of CAD, HTN, enlarged thoracic aorta, HLD, GERD  seen by Laurann Montana, NP 09/16/2018.  Of note is he has had evaluation in June with an echocardiogram shows EF of 55 to 60% mild LVH and mild to moderate dilatation of the ascending aorta  and 39 mm.  Subsequent CTA showed severe LAD disease. He declined left heart catheterization and intervention and elected medical treatment.   He was last seen 08/01/2019 with chest pain and syncope. Compliance with diet, lifestyle and medications: No, he stopped amlodipine and hydrochlorothiazide.  Since then he has done well home blood pressure runs 120-130/70.  He is back to full activity no angina dyspnea palpitation or syncope.  I again reviewed my recommendation undergo coronary angiography is high risk cardiac CTA he declines and be seen by me in 3 months to keep a close eye on it.  He agrees any further episodes of chest pain or exertional post exertional syncope he will reconsider.  I am not putting him on a beta-blocker with his resting bradycardia.  08/01/2019:  Ref Range & Units 3 wk ago  Troponin I (High Sensitivity) <18 ng/L 8     Past Medical History:  Diagnosis Date  . 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 Coronary artery disease   . Encounter for health maintenance examination with abnormal findings 05/07/2017  . Fibromyalgia 06/24/2018  . Gastroesophageal reflux disease 05/07/2017  . Hypertension 06/24/2018  . 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 Palpitations 06/25/2018  . 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 Trigger finger of all digits of both hands 05/07/2017  Past Surgical History:  Procedure Laterality Date  . BACK SURGERY    . RETINAL DETACHMENT SURGERY      Current Medications: Current Meds  Medication Sig  . aspirin EC 81 MG tablet Take 1 tablet (81 mg total) by mouth daily.  . lansoprazole (PREVACID) 30 MG capsule Take 1 capsule by mouth once daily  . nitroGLYCERIN (NITROSTAT) 0.4 MG SL tablet Place 1 tablet (0.4 mg total) under the tongue every 5 (five) minutes as needed.  . rosuvastatin (CRESTOR) 5 MG tablet TAKE 1 TABLET BY MOUTH ONCE DAILY AT  6  P.M.  . [DISCONTINUED] rosuvastatin (CRESTOR) 5 MG tablet TAKE 1 TABLET BY MOUTH ONCE DAILY AT   6  P.M.     Allergies:   Patient has no known allergies.   Social History   Socioeconomic History  . Marital status: Married    Spouse name: Not on file  . Number of children: Not on file  . Years of education: Not on file  . Highest education level: Not on file  Occupational History  . Not on file  Tobacco Use  . Smoking status: Never Smoker  . Smokeless tobacco: Never Used  Vaping Use  . Vaping Use: Never used  Substance and Sexual Activity  . Alcohol use: Never  . Drug use: Never  . Sexual activity: Not on file  Other Topics Concern  . Not on file  Social History Narrative  . Not on file   Social Determinants of Health   Financial Resource Strain:   . Difficulty of Paying Living Expenses:   Food Insecurity:   . Worried About Charity fundraiser in the Last Year:   . Arboriculturist in the Last Year:   Transportation Needs:   . Film/video editor (Medical):   Marland Kitchen Lack of Transportation (Non-Medical):   Physical Activity:   . Days of Exercise per Week:   . Minutes of Exercise per Session:   Stress:   . Feeling of Stress :   Social Connections:   . Frequency of Communication with Friends and Family:   . Frequency of Social Gatherings with Friends and Family:   . Attends Religious Services:   . Active Member of Clubs or Organizations:   . Attends Archivist Meetings:   Marland Kitchen Marital Status:      Family History: The patient's family history includes Heart Problems in his maternal uncle; Hypertension in his brother and mother. ROS:   Please see the history of present illness.    All other systems reviewed and are negative.  EKGs/Labs/Other Studies Reviewed:    The following studies were reviewed today:  EKG:  EKG 08/01/2019 showed sinus bradycardia otherwise normal  Recent Labs: 08/01/2019: ALT 23; BUN 12; Creatinine, Ser 0.94; Hemoglobin 16.2; Platelets 209; Potassium 4.1; Sodium 142  Recent Lipid Panel    Component Value Date/Time   CHOL  119 05/21/2019 1626   TRIG 87 05/21/2019 1626   HDL 48 05/21/2019 1626   CHOLHDL 2.5 05/21/2019 1626   CHOLHDL 4 05/07/2017 1608   VLDL 26.0 05/07/2017 1608   LDLCALC 54 05/21/2019 1626   LDLDIRECT 57 09/16/2018 1502    Physical Exam:    VS:  BP 120/88 (BP Location: Right Arm, Patient Position: Sitting, Cuff Size: Normal)   Pulse (!) 56   Ht 6\' 2"  (1.88 m)   Wt 226 lb (102.5 kg)   SpO2 96%   BMI 29.02 kg/m     Wt  Readings from Last 3 Encounters:  08/29/19 226 lb (102.5 kg)  08/01/19 220 lb (99.8 kg)  08/01/19 223 lb 6.4 oz (101.3 kg)     GEN:  Well nourished, well developed in no acute distress HEENT: Normal NECK: No JVD; No carotid bruits LYMPHATICS: No lymphadenopathy CARDIAC: RRR, no murmurs, rubs, gallops RESPIRATORY:  Clear to auscultation without rales, wheezing or rhonchi  ABDOMEN: Soft, non-tender, non-distended MUSCULOSKELETAL:  No edema; No deformity  SKIN: Warm and dry NEUROLOGIC:  Alert and oriented x 3 PSYCHIATRIC:  Normal affect    Signed, Shirlee More, MD  08/29/2019 3:29 PM    [facility]

## 2019-08-29 ENCOUNTER — Other Ambulatory Visit: Payer: Self-pay

## 2019-08-29 ENCOUNTER — Encounter: Payer: Self-pay | Admitting: Cardiology

## 2019-08-29 ENCOUNTER — Ambulatory Visit (INDEPENDENT_AMBULATORY_CARE_PROVIDER_SITE_OTHER): Payer: Medicare Other | Admitting: Cardiology

## 2019-08-29 VITALS — BP 120/88 | HR 56 | Ht 74.0 in | Wt 226.0 lb

## 2019-08-29 DIAGNOSIS — I7789 Other specified disorders of arteries and arterioles: Secondary | ICD-10-CM | POA: Diagnosis not present

## 2019-08-29 DIAGNOSIS — I25118 Atherosclerotic heart disease of native coronary artery with other forms of angina pectoris: Secondary | ICD-10-CM | POA: Diagnosis not present

## 2019-08-29 DIAGNOSIS — I10 Essential (primary) hypertension: Secondary | ICD-10-CM

## 2019-08-29 DIAGNOSIS — E782 Mixed hyperlipidemia: Secondary | ICD-10-CM

## 2019-08-29 MED ORDER — ROSUVASTATIN CALCIUM 5 MG PO TABS: ORAL_TABLET | ORAL | 1 refills | Status: DC

## 2019-08-29 NOTE — Patient Instructions (Signed)

## 2019-10-23 ENCOUNTER — Other Ambulatory Visit: Payer: Self-pay | Admitting: Family

## 2019-10-23 DIAGNOSIS — I10 Essential (primary) hypertension: Secondary | ICD-10-CM

## 2019-11-27 ENCOUNTER — Other Ambulatory Visit: Payer: Self-pay

## 2019-11-30 NOTE — Progress Notes (Deleted)
Cardiology Office Note:    Date:  11/30/2019   ID:  Jeffrey Poole, DOB [birth year], MRN [id number]  PCP:  Libby Maw, MD  Cardiologist:  Shirlee More, MD    Referring MD: Libby Maw,*    ASSESSMENT:    No diagnosis found. PLAN:    In order of problems listed above:  1. ***   Next appointment: ***   Medication Adjustments/Labs and Tests Ordered: Current medicines are reviewed at length with the patient today.  Concerns regarding medicines are outlined above.  No orders of the defined types were placed in this encounter.  No orders of the defined types were placed in this encounter.   No chief complaint on file.   History of Present Illness:    Jeffrey Poole is a 69 y.o. male with a hx of coronary artery disease with severe LAD stenosis and cardiac CTA treated medically at the patient's decision, hypertension hyperlipidemia and enlarged thoracic aorta last seen 08/29/2019.  He is not on a beta-blocker due to resting sinus bradycardia. Compliance with diet, lifestyle and medications: *** Past Medical History:  Diagnosis Date  . 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 Coronary artery disease   . Encounter for health maintenance examination with abnormal findings 05/07/2017  . Fibromyalgia 06/24/2018  . Gastroesophageal reflux disease 05/07/2017  . Hypertension 06/24/2018  . 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 Palpitations 06/25/2018  . 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 Trigger finger of all digits of both hands 05/07/2017    Past Surgical History:  Procedure Laterality Date  . BACK SURGERY    . RETINAL DETACHMENT SURGERY      Current Medications: No outpatient medications have been marked as taking for the 12/01/19 encounter (Appointment) with Richardo Priest, MD.     Allergies:   Patient has no known allergies.   Social History   Socioeconomic History  . Marital status: Married    Spouse name: Not on file  . Number of  children: Not on file  . Years of education: Not on file  . Highest education level: Not on file  Occupational History  . Not on file  Tobacco Use  . Smoking status: Never Smoker  . Smokeless tobacco: Never Used  Vaping Use  . Vaping Use: Never used  Substance and Sexual Activity  . Alcohol use: Never  . Drug use: Never  . Sexual activity: Not on file  Other Topics Concern  . Not on file  Social History Narrative  . Not on file   Social Determinants of Health   Financial Resource Strain:   . Difficulty of Paying Living Expenses: Not on file  Food Insecurity:   . Worried About Charity fundraiser in the Last Year: Not on file  . Ran Out of Food in the Last Year: Not on file  Transportation Needs:   . Lack of Transportation (Medical): Not on file  . Lack of Transportation (Non-Medical): Not on file  Physical Activity:   . Days of Exercise per Week: Not on file  . Minutes of Exercise per Session: Not on file  Stress:   . Feeling of Stress : Not on file  Social Connections:   . Frequency of Communication with Friends and Family: Not on file  . Frequency of Social Gatherings with Friends and Family: Not on file  . Attends Religious Services: Not on file  . Active Member of Clubs or Organizations: Not on file  .  Attends Archivist Meetings: Not on file  . Marital Status: Not on file     Family History: The patient's ***family history includes Heart Problems in his maternal uncle; Hypertension in his brother and mother. ROS:   Please see the history of present illness.    All other systems reviewed and are negative.  EKGs/Labs/Other Studies Reviewed:    The following studies were reviewed today:  EKG:  EKG ordered today and personally reviewed.  The ekg ordered today demonstrates ***  Recent Labs: 08/01/2019: ALT 23; BUN 12; Creatinine, Ser 0.94; Hemoglobin 16.2; Platelets 209; Potassium 4.1; Sodium 142  Recent Lipid Panel    Component Value Date/Time    CHOL 119 05/21/2019 1626   TRIG 87 05/21/2019 1626   HDL 48 05/21/2019 1626   CHOLHDL 2.5 05/21/2019 1626   CHOLHDL 4 05/07/2017 1608   VLDL 26.0 05/07/2017 1608   LDLCALC 54 05/21/2019 1626   LDLDIRECT 57 09/16/2018 1502    Physical Exam:    VS:  There were no vitals taken for this visit.    Wt Readings from Last 3 Encounters:  08/29/19 226 lb (102.5 kg)  08/01/19 220 lb (99.8 kg)  08/01/19 223 lb 6.4 oz (101.3 kg)     GEN: *** Well nourished, well developed in no acute distress HEENT: Normal NECK: No JVD; No carotid bruits LYMPHATICS: No lymphadenopathy CARDIAC: ***RRR, no murmurs, rubs, gallops RESPIRATORY:  Clear to auscultation without rales, wheezing or rhonchi  ABDOMEN: Soft, non-tender, non-distended MUSCULOSKELETAL:  No edema; No deformity  SKIN: Warm and dry NEUROLOGIC:  Alert and oriented x 3 PSYCHIATRIC:  Normal affect    Signed, Shirlee More, MD  11/30/2019 11:51 AM    [place]

## 2019-12-01 ENCOUNTER — Ambulatory Visit: Payer: Medicare Other | Admitting: Cardiology

## 2019-12-08 DIAGNOSIS — L600 Ingrowing nail: Secondary | ICD-10-CM | POA: Diagnosis not present

## 2019-12-08 DIAGNOSIS — D2371 Other benign neoplasm of skin of right lower limb, including hip: Secondary | ICD-10-CM | POA: Diagnosis not present

## 2019-12-23 ENCOUNTER — Other Ambulatory Visit: Payer: Self-pay | Admitting: Family

## 2019-12-24 NOTE — Telephone Encounter (Signed)
Refill sent to pharmacy.   

## 2020-03-04 ENCOUNTER — Other Ambulatory Visit: Payer: Self-pay | Admitting: Cardiology

## 2020-04-29 ENCOUNTER — Ambulatory Visit (INDEPENDENT_AMBULATORY_CARE_PROVIDER_SITE_OTHER): Payer: Medicare Other | Admitting: Family Medicine

## 2020-04-29 ENCOUNTER — Encounter: Payer: Self-pay | Admitting: Family Medicine

## 2020-04-29 ENCOUNTER — Other Ambulatory Visit: Payer: Self-pay

## 2020-04-29 VITALS — BP 122/70 | HR 52 | Temp 97.6°F | Ht 74.0 in | Wt 231.2 lb

## 2020-04-29 DIAGNOSIS — Z0000 Encounter for general adult medical examination without abnormal findings: Secondary | ICD-10-CM

## 2020-04-29 DIAGNOSIS — I2583 Coronary atherosclerosis due to lipid rich plaque: Secondary | ICD-10-CM

## 2020-04-29 DIAGNOSIS — I2510 Atherosclerotic heart disease of native coronary artery without angina pectoris: Secondary | ICD-10-CM | POA: Diagnosis not present

## 2020-04-29 DIAGNOSIS — Z125 Encounter for screening for malignant neoplasm of prostate: Secondary | ICD-10-CM | POA: Diagnosis not present

## 2020-04-29 DIAGNOSIS — S46212A Strain of muscle, fascia and tendon of other parts of biceps, left arm, initial encounter: Secondary | ICD-10-CM

## 2020-04-29 DIAGNOSIS — I10 Essential (primary) hypertension: Secondary | ICD-10-CM | POA: Diagnosis not present

## 2020-04-29 DIAGNOSIS — E291 Testicular hypofunction: Secondary | ICD-10-CM | POA: Diagnosis not present

## 2020-04-29 DIAGNOSIS — R3912 Poor urinary stream: Secondary | ICD-10-CM

## 2020-04-29 DIAGNOSIS — E7800 Pure hypercholesterolemia, unspecified: Secondary | ICD-10-CM | POA: Diagnosis not present

## 2020-04-29 LAB — URINALYSIS, ROUTINE W REFLEX MICROSCOPIC
Hgb urine dipstick: NEGATIVE
Ketones, ur: NEGATIVE
Nitrite: NEGATIVE
RBC / HPF: NONE SEEN (ref 0–?)
Specific Gravity, Urine: 1.02 (ref 1.000–1.030)
Urine Glucose: NEGATIVE
Urobilinogen, UA: 0.2 (ref 0.0–1.0)
pH: 6 (ref 5.0–8.0)

## 2020-04-29 NOTE — Patient Instructions (Signed)
Health Maintenance After Age 70 After age 64, you are at a higher risk for certain long-term diseases and infections as well as injuries from falls. Falls are a major cause of broken bones and head injuries in people who are older than age 41. Getting regular preventive care can help to keep you healthy and well. Preventive care includes getting regular testing and making lifestyle changes as recommended by your health care provider. Talk with your health care provider about:  Which screenings and tests you should have. A screening is a test that checks for a disease when you have no symptoms.  A diet and exercise plan that is right for you. What should I know about screenings and tests to prevent falls? Screening and testing are the best ways to find a health problem early. Early diagnosis and treatment give you the best chance of managing medical conditions that are common after age 19. Certain conditions and lifestyle choices may make you more likely to have a fall. Your health care provider may recommend:  Regular vision checks. Poor vision and conditions such as cataracts can make you more likely to have a fall. If you wear glasses, make sure to get your prescription updated if your vision changes.  Medicine review. Work with your health care provider to regularly review all of the medicines you are taking, including over-the-counter medicines. Ask your health care provider about any side effects that may make you more likely to have a fall. Tell your health care provider if any medicines that you take make you feel dizzy or sleepy.  Osteoporosis screening. Osteoporosis is a condition that causes the bones to get weaker. This can make the bones weak and cause them to break more easily.  Blood pressure screening. Blood pressure changes and medicines to control blood pressure can make you feel dizzy.  Strength and balance checks. Your health care provider may recommend certain tests to check your  strength and balance while standing, walking, or changing positions.  Foot health exam. Foot pain and numbness, as well as not wearing proper footwear, can make you more likely to have a fall.  Depression screening. You may be more likely to have a fall if you have a fear of falling, feel emotionally low, or feel unable to do activities that you used to do.  Alcohol use screening. Using too much alcohol can affect your balance and may make you more likely to have a fall. What actions can I take to lower my risk of falls? General instructions  Talk with your health care provider about your risks for falling. Tell your health care provider if: ? You fall. Be sure to tell your health care provider about all falls, even ones that seem minor. ? You feel dizzy, sleepy, or off-balance.  Take over-the-counter and prescription medicines only as told by your health care provider. These include any supplements.  Eat a healthy diet and maintain a healthy weight. A healthy diet includes low-fat dairy products, low-fat (lean) meats, and fiber from whole grains, beans, and lots of fruits and vegetables. Home safety  Remove any tripping hazards, such as rugs, cords, and clutter.  Install safety equipment such as grab bars in bathrooms and safety rails on stairs.  Keep rooms and walkways well-lit. Activity  Follow a regular exercise program to stay fit. This will help you maintain your balance. Ask your health care provider what types of exercise are appropriate for you.  If you need a cane or walker,  use it as recommended by your health care provider.  Wear supportive shoes that have nonskid soles.   Lifestyle  Do not drink alcohol if your health care provider tells you not to drink.  If you drink alcohol, limit how much you have: ? 0-1 drink a day for women. ? 0-2 drinks a day for men.  Be aware of how much alcohol is in your drink. In the [place] one drink equals one typical bottle of beer (12  oz), one-half glass of wine (5 oz), or one shot of hard liquor (1 oz).  Do not use any products that contain nicotine or tobacco, such as cigarettes and e-cigarettes. If you need help quitting, ask your health care provider. Summary  Having a healthy lifestyle and getting preventive care can help to protect your health and wellness after age 95.  Screening and testing are the best way to find a health problem early and help you avoid having a fall. Early diagnosis and treatment give you the best chance for managing medical conditions that are more common for people who are older than age 50.  Falls are a major cause of broken bones and head injuries in people who are older than age 72. Take precautions to prevent a fall at home.  Work with your health care provider to learn what changes you can make to improve your health and wellness and to prevent falls. This information is not intended to replace advice given to you by your health care provider. Make sure you discuss any questions you have with your health care provider. Document Revised: 04/25/2018 Document Reviewed: 11/15/2016 Elsevier Patient Education  [place] 65 Years and Older, Male Preventive care refers to lifestyle choices and visits with your health care provider that can promote health and wellness. This includes:  A yearly physical exam. This is also called an annual wellness visit.  Regular dental and eye exams.  Immunizations.  Screening for certain conditions.  Healthy lifestyle choices, such as: ? Eating a healthy diet. ? Getting regular exercise. ? Not using drugs or products that contain nicotine and tobacco. ? Limiting alcohol use. What can I expect for my preventive care visit? Physical exam Your health care provider will check your:  Height and weight. These may be used to calculate your BMI (body mass index). BMI is a measurement that tells if you are at a healthy  weight.  Heart rate and blood pressure.  Body temperature.  Skin for abnormal spots. Counseling Your health care provider may ask you questions about your:  Past medical problems.  Family's medical history.  Alcohol, tobacco, and drug use.  Emotional well-being.  Home life and relationship well-being.  Sexual activity.  Diet, exercise, and sleep habits.  History of falls.  Memory and ability to understand (cognition).  Work and work Statistician.  Access to firearms. What immunizations do I need? Vaccines are usually given at various ages, according to a schedule. Your health care provider will recommend vaccines for you based on your age, medical history, and lifestyle or other factors, such as travel or where you work.   What tests do I need? Blood tests  Lipid and cholesterol levels. These may be checked every 5 years, or more often depending on your overall health.  Hepatitis C test.  Hepatitis B test. Screening  Lung cancer screening. You may have this screening every year starting at age 25 if you have a 30-pack-year history of smoking and currently  smoke or have quit within the past 15 years.  Colorectal cancer screening. ? All adults should have this screening starting at age 12 and continuing until age 20. ? Your health care provider may recommend screening at age 61 if you are at increased risk. ? You will have tests every 1-10 years, depending on your results and the type of screening test.  Prostate cancer screening. Recommendations will vary depending on your family history and other risks.  Genital exam to check for testicular cancer or hernias.  Diabetes screening. ? This is done by checking your blood sugar (glucose) after you have not eaten for a while (fasting). ? You may have this done every 1-3 years.  Abdominal aortic aneurysm (AAA) screening. You may need this if you are a current or former smoker.  STD (sexually transmitted disease)  testing, if you are at risk. Follow these instructions at home: Eating and drinking  Eat a diet that includes fresh fruits and vegetables, whole grains, lean protein, and low-fat dairy products. Limit your intake of foods with high amounts of sugar, saturated fats, and salt.  Take vitamin and mineral supplements as recommended by your health care provider.  Do not drink alcohol if your health care provider tells you not to drink.  If you drink alcohol: ? Limit how much you have to 0-2 drinks a day. ? Be aware of how much alcohol is in your drink. In the [place] one drink equals one 12 oz bottle of beer (355 mL), one 5 oz glass of wine (148 mL), or one 1 oz glass of hard liquor (44 mL).   Lifestyle  Take daily care of your teeth and gums. Brush your teeth every morning and night with fluoride toothpaste. Floss one time each day.  Stay active. Exercise for at least 30 minutes 5 or more days each week.  Do not use any products that contain nicotine or tobacco, such as cigarettes, e-cigarettes, and chewing tobacco. If you need help quitting, ask your health care provider.  Do not use drugs.  If you are sexually active, practice safe sex. Use a condom or other form of protection to prevent STIs (sexually transmitted infections).  Talk with your health care provider about taking a low-dose aspirin or statin.  Find healthy ways to cope with stress, such as: ? Meditation, yoga, or listening to music. ? Journaling. ? Talking to a trusted person. ? Spending time with friends and family. Safety  Always wear your seat belt while driving or riding in a vehicle.  Do not drive: ? If you have been drinking alcohol. Do not ride with someone who has been drinking. ? When you are tired or distracted. ? While texting.  Wear a helmet and other protective equipment during sports activities.  If you have firearms in your house, make sure you follow all gun safety procedures. What's next?  Visit  your health care provider once a year for an annual wellness visit.  Ask your health care provider how often you should have your eyes and teeth checked.  Stay up to date on all vaccines. This information is not intended to replace advice given to you by your health care provider. Make sure you discuss any questions you have with your health care provider. Document Revised: 10/01/2018 Document Reviewed: 12/27/2017 Elsevier Patient Education  2021 [place].  Proximal Biceps Tendon Tear  The proximal biceps tendon is a strong cord of tissue that connects the biceps muscle--which is the muscle on  the front of the upper arm--to the shoulder blade. A proximal biceps tendon tear (rupture) can include a partial or complete tear of the tendon near where it connects to the bone of the shoulder. This injury can interfere with your ability to lift your arm in front of your body, stabilize your shoulder, bend your elbow, and turn your hand palm-up (supination). What are the causes? This condition happens when too much force is placed on the tendon. This excess force may be caused by:  The elbow being suddenly straightened from a bent position because of an external force. This could happen, for example, while catching a heavy weight or being pulled when waterskiing.  Wear and tear from physical activity.  Breaking a fall with your hand. What increases the risk? The following factors may make you more likely to develop this condition:  Playing contact sports.  Doing activities or sports that involve throwing or overhead movements, such as racket sports, gymnastics, or baseball.  Doing activities or sports that involve putting sudden force on the arm, such as weight lifting or waterskiing.  Having a weakened tendon because of: ? Long-lasting (chronic) biceps tendinitis. ? Certain medical conditions, such as diabetes or rheumatoid arthritis. ? Repeated corticosteroid use. ? Repetitive  overhead movements. What are the signs or symptoms? Symptoms of this condition may include:  Sudden sharp pain in the front of the shoulder. Pain may get worse during certain movements, such as: ? Lifting or carrying objects. ? Straightening the elbow. ? Throwing or using overhead movements.  Inflammation or a feeling of unusual warmth on the front of the shoulder.  Painful tightening (spasm) of the biceps muscle.  A bulge on the inside of the upper arm when the elbow is bent.  Bruising in the shoulder or upper arm. This may develop 24-48 hours after the tendon is injured.  Limited range of motion of the shoulder and elbow.  Weakness in the elbow and forearm when: ? Bending the elbow. ? Rotating the wrist. How is this diagnosed? This condition may be diagnosed based on:  Your symptoms and medical history.  A physical exam. Your health care provider may test the strength and range of motion of your shoulder and elbow.  Imaging tests, such as: ? X-rays. ? MRI. ? Ultrasound. How is this treated? Treatment depends on the severity of the condition. Treatment may include:  Medicines to help relieve pain and inflammation.  Resting and icing the injured area.  Avoiding certain activities that put stress on your shoulder.  Physical therapy.  Surgery to repair the tear. This may be needed if nonsurgical treatments do not improve your condition.  One or more injections of medicines (corticosteroids) into your upper arm to help reduce inflammation. This treatment is rare. Follow these instructions at home: Managing pain, stiffness, and swelling  If directed, put ice on the injured area: ? Put ice in a plastic bag. ? Place a towel between your skin and the bag. ? Leave the ice on for 20 minutes, 2-3 times a day.  If directed, apply heat to the affected area before you exercise or as often as told by your health care provider. Use the heat source that your health care  provider recommends, such as a moist heat pack or a heating pad. ? Place a towel between your skin and the heat source. ? Leave the heat on for 20-30 minutes. ? Remove the heat if your skin turns bright red. This is especially important if  you are unable to feel pain, heat, or cold. You may have a greater risk of getting burned.  Move your fingers often to avoid stiffness and to lessen swelling.  Raise (elevate) the injured area while you are sitting or lying down.      Activity  Return to your normal activities as told by your health care provider. Ask your health care provider what activities are safe for you.  Avoid activities that cause pain or make your condition worse.  Do not lift anything that is heavier than 10 lb (4.5 kg), or the limit that you are told, until your health care provider says that it is safe.  Do exercises as told by your physical therapist or health care provider. General instructions  Take over-the-counter and prescription medicines only as told by your health care provider.  Do not use any products that contain nicotine or tobacco, such as cigarettes and e-cigarettes. If you need help quitting, ask your health care provider.  Keep all follow-up visits as told by your health care provider. This is important. How is this prevented? Take these steps to help prevent reinjury:  Warm up and stretch before being active.  Cool down and stretch after being active.  Give your body time to rest between periods of activity.  Make sure you use equipment that fits you.  Be safe and responsible while being active. This will help you avoid falls.  Maintain physical fitness, including strength and flexibility. Contact a health care provider if:  You have symptoms that get worse or do not get better after 2 weeks of treatment.  You develop new symptoms. Get help right away if:  You have severe pain.  You develop pain or numbness in your hand.  Your hand  feels unusually cold.  Your fingernails turn a dark color, such as blue or gray. Summary  A proximal biceps tendon tear can include a partial or complete tear of the tendon near where it connects to the bone of the shoulder.  This condition happens when too much force is placed on the tendon.  Treatment may include resting and icing the injured area.  Avoid activities that cause pain or make your condition worse. This information is not intended to replace advice given to you by your health care provider. Make sure you discuss any questions you have with your health care provider. Document Revised: 12/24/2017 Document Reviewed: 05/28/2017 Elsevier Patient Education  [place].

## 2020-04-29 NOTE — Progress Notes (Signed)
Established Patient Office Visit  Subjective:  Patient ID: Jeffrey Poole, male    DOB: 09/30/1950  Age: 70 y.o. MRN: [id number]  CC:  Chief Complaint  Patient presents with  . Annual Exam    CPE, patient would like lab work fasting for labs.    HPI KEATS KINGRY presents for a complete physical and follow-up of some ongoing medical issues.  Has been diagnosed with coronary artery disease since the last time he saw me.  Cardiology recommended a stent but patient wanted to treat it medically at least initially.  With blood pressure and cholesterol control he has had no chest pain and rarely experiences shortness of breath.  His exercise has not been limited.  He does not smoke or drink alcohol.  Continues normal activity.  He is having no problems taking amlodipine or rosuvastatin.  GERD is well controlled with Prevacid.  History of androgen deficiency.  Some decrease in urine flow.  He injured his upper left arm lifting a concrete birdbath base 5 days ago.  There is persisting tenderness.  Past Medical History:  Diagnosis Date  . 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 Coronary artery disease   . Encounter for health maintenance examination with abnormal findings 05/07/2017  . Fibromyalgia 06/24/2018  . Gastroesophageal reflux disease 05/07/2017  . Hypertension 06/24/2018  . 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 Palpitations 06/25/2018  . 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 Trigger finger of all digits of both hands 05/07/2017    Past Surgical History:  Procedure Laterality Date  . BACK SURGERY    . RETINAL DETACHMENT SURGERY      Family History  Problem Relation Age of Onset  . Hypertension Mother   . Hypertension Brother   . Heart Problems Maternal Uncle     Social History   Socioeconomic History  . Marital status: Married    Spouse name: Not on file  . Number of children: Not on file  . Years of education: Not on file  . Highest education level: Not on file   Occupational History  . Not on file  Tobacco Use  . Smoking status: Never Smoker  . Smokeless tobacco: Never Used  Vaping Use  . Vaping Use: Never used  Substance and Sexual Activity  . Alcohol use: Never  . Drug use: Never  . Sexual activity: Not on file  Other Topics Concern  . Not on file  Social History Narrative  . Not on file   Social Determinants of Health   Financial Resource Strain: Not on file  Food Insecurity: Not on file  Transportation Needs: Not on file  Physical Activity: Not on file  Stress: Not on file  Social Connections: Not on file  Intimate Partner Violence: Not on file    Outpatient Medications Prior to Visit  Medication Sig Dispense Refill  . lansoprazole (PREVACID) 30 MG capsule Take 1 capsule by mouth once daily 30 capsule 0  . rosuvastatin (CRESTOR) 5 MG tablet TAKE 1 TABLET BY MOUTH ONCE DAILY AT  6  P.M. (Patient taking differently: TAKE 1 TABLET BY MOUTH ONCE DAILY AT  6  P.M.) 90 tablet 1  . amLODipine (NORVASC) 10 MG tablet Take 1 tablet by mouth once daily (Patient not taking: Reported on 04/29/2020) 30 tablet 0  . aspirin EC 81 MG tablet Take 1 tablet (81 mg total) by mouth daily. (Patient not taking: Reported on 04/29/2020) 90 tablet 3  . nitroGLYCERIN (NITROSTAT) 0.4 MG  SL tablet Place 1 tablet (0.4 mg total) under the tongue every 5 (five) minutes as needed. (Patient not taking: Reported on 04/29/2020) 25 tablet 3   No facility-administered medications prior to visit.    No Known Allergies  ROS Review of Systems  Constitutional: Negative.   HENT: Negative.   Eyes: Negative for photophobia and visual disturbance.  Respiratory: Negative.   Cardiovascular: Negative.   Gastrointestinal: Negative.   Endocrine: Negative for polyphagia and polyuria.  Genitourinary: Positive for difficulty urinating. Negative for dysuria, frequency and urgency.  Musculoskeletal: Positive for myalgias.  Skin: Negative for pallor and rash.   Allergic/Immunologic: Negative for immunocompromised state.  Neurological: Negative for speech difficulty and weakness.  Hematological: Does not bruise/bleed easily.  Psychiatric/Behavioral: Negative.       Objective:    Physical Exam Constitutional:      General: He is not in acute distress.    Appearance: Normal appearance. He is not ill-appearing, toxic-appearing or diaphoretic.  HENT:     Head: Normocephalic and atraumatic.     Right Ear: Tympanic membrane, ear canal and external ear normal.     Left Ear: Tympanic membrane, ear canal and external ear normal.     Mouth/Throat:     Mouth: Mucous membranes are moist.     Pharynx: Oropharynx is clear. No oropharyngeal exudate or posterior oropharyngeal erythema.  Eyes:     General:        Right eye: No discharge.        Left eye: No discharge.     Conjunctiva/sclera: Conjunctivae normal.     Pupils: Pupils are equal, round, and reactive to light.  Neck:     Vascular: No carotid bruit.  Cardiovascular:     Rate and Rhythm: Normal rate and regular rhythm.  Pulmonary:     Effort: Pulmonary effort is normal.     Breath sounds: Normal breath sounds.  Abdominal:     General: Abdomen is flat. Bowel sounds are normal. There is no distension.     Palpations: There is no mass.     Tenderness: There is no abdominal tenderness. There is no guarding or rebound.     Hernia: No hernia is present. There is no hernia in the left inguinal area or right inguinal area.  Genitourinary:    Penis: Uncircumcised. No phimosis, paraphimosis, hypospadias, erythema, tenderness or discharge.      Testes:        Right: Mass, tenderness or swelling not present. Right testis is descended.        Left: Mass, tenderness or swelling not present. Left testis is descended.     Prostate: Enlarged. Not tender and no nodules present.     Rectum: Guaiac result negative. No mass, tenderness, anal fissure, external hemorrhoid or internal hemorrhoid. Normal anal  tone.  Musculoskeletal:     Left upper arm: Deformity and tenderness present.       Arms:     Cervical back: No rigidity or tenderness.     Right lower leg: No edema.     Left lower leg: No edema.  Lymphadenopathy:     Cervical: No cervical adenopathy.     Lower Body: No right inguinal adenopathy. No left inguinal adenopathy.  Skin:    General: Skin is warm and dry.  Neurological:     Mental Status: He is alert and oriented to person, place, and time.  Psychiatric:        Mood and Affect: Mood normal.  Behavior: Behavior normal.     BP 122/70   Pulse (!) 52   Temp 97.6 F (36.4 C) (Temporal)   Ht 6\' 2"  (1.88 m)   Wt 231 lb 3.2 oz (104.9 kg)   SpO2 96%   BMI 29.68 kg/m  Wt Readings from Last 3 Encounters:  04/29/20 231 lb 3.2 oz (104.9 kg)  08/29/19 226 lb (102.5 kg)  08/01/19 220 lb (99.8 kg)     Health Maintenance Due  Topic Date Due  . TETANUS/TDAP  Never done  . PNA vac Low Risk Adult (2 of 2 - PPSV23) 12/04/2017    There are no preventive care reminders to display for this patient.  Lab Results  Component Value Date   TSH 1.29 05/07/2017   Lab Results  Component Value Date   WBC 7.0 08/01/2019   HGB 16.2 08/01/2019   HCT 49.0 08/01/2019   MCV 83.6 08/01/2019   PLT 209 08/01/2019   Lab Results  Component Value Date   NA 142 08/01/2019   K 4.1 08/01/2019   CO2 27 08/01/2019   GLUCOSE 109 (H) 08/01/2019   BUN 12 08/01/2019   CREATININE 0.94 08/01/2019   BILITOT 0.9 08/01/2019   ALKPHOS 75 08/01/2019   AST 26 08/01/2019   ALT 23 08/01/2019   PROT 7.8 08/01/2019   ALBUMIN 4.4 08/01/2019   CALCIUM 9.4 08/01/2019   ANIONGAP 9 08/01/2019   GFR 86.15 05/07/2017   Lab Results  Component Value Date   CHOL 119 05/21/2019   Lab Results  Component Value Date   HDL 48 05/21/2019   Lab Results  Component Value Date   LDLCALC 54 05/21/2019   Lab Results  Component Value Date   TRIG 87 05/21/2019   Lab Results  Component Value Date    CHOLHDL 2.5 05/21/2019   No results found for: HGBA1C    Assessment & Plan:   Problem List Items Addressed This Visit      Cardiovascular and Mediastinum   Essential hypertension - Primary   Relevant Orders   Comprehensive metabolic panel   CBC   Urinalysis, Routine w reflex microscopic   Coronary artery disease due to lipid rich plaque     Endocrine   Androgen deficiency   Relevant Orders   Testosterone Total,Free,Bio, Males-([facility])     Musculoskeletal and Integument   Tear of left biceps muscle   Relevant Orders   Ambulatory referral to Sports Medicine     Other   Healthcare maintenance   Weak urinary stream   Relevant Orders   PSA   Elevated cholesterol   Relevant Orders   Comprehensive metabolic panel   LDL cholesterol, direct   Lipid panel      No orders of the defined types were placed in this encounter.   Follow-up: Return in about 6 months (around 10/29/2020).   Patient was given information on health maintenance and disease prevention and encouraged him to look over it and call with any questions.  Encouraged him to stay active.  Continue medicines as above.  Sports medicine consultation for biceps injury. Libby Maw, MD

## 2020-04-30 LAB — PSA: PSA: 2.16 ng/mL (ref <=4.0)

## 2020-04-30 LAB — SPECIMEN COMPROMISED

## 2020-04-30 LAB — COMPREHENSIVE METABOLIC PANEL
AG Ratio: 1.5 (calc) (ref 1.0–2.5)
ALT: 18 U/L (ref 9–46)
AST: 21 U/L (ref 10–35)
Albumin: 4.6 g/dL (ref 3.6–5.1)
Alkaline phosphatase (APISO): 81 U/L (ref 35–144)
BUN: 10 mg/dL (ref 7–25)
CO2: 24 mmol/L (ref 20–32)
Calcium: 9.7 mg/dL (ref 8.6–10.3)
Chloride: 105 mmol/L (ref 98–110)
Creat: 0.9 mg/dL (ref 0.70–1.25)
Globulin: 3.1 g/dL (calc) (ref 1.9–3.7)
Glucose, Bld: 90 mg/dL (ref 65–99)
Potassium: 4.2 mmol/L (ref 3.5–5.3)
Sodium: 141 mmol/L (ref 135–146)
Total Bilirubin: 0.9 mg/dL (ref 0.2–1.2)
Total Protein: 7.7 g/dL (ref 6.1–8.1)

## 2020-04-30 LAB — TESTOSTERONE TOTAL,FREE,BIO, MALES
Albumin: 4.6 g/dL (ref 3.6–5.1)
Sex Hormone Binding: 63 nmol/L (ref 22–77)
Testosterone, Bioavailable: 77.5 ng/dL — ABNORMAL LOW (ref >=110.0)
Testosterone, Free: 36.9 pg/mL — ABNORMAL LOW (ref 46.0–224.0)
Testosterone: 490 ng/dL (ref 250–827)

## 2020-04-30 LAB — CBC
HCT: 49 % (ref 38.5–50.0)
Hemoglobin: 16.1 g/dL (ref 13.2–17.1)
MCH: 27 pg (ref 27.0–33.0)
MCHC: 32.9 g/dL (ref 32.0–36.0)
MCV: 82.1 fL (ref 80.0–100.0)
MPV: 11.4 fL (ref 7.5–12.5)
Platelets: 229 10*3/uL (ref 140–400)
RBC: 5.97 10*6/uL — ABNORMAL HIGH (ref 4.20–5.80)
RDW: 14.4 % (ref 11.0–15.0)
WBC: 6.7 10*3/uL (ref 3.8–10.8)

## 2020-04-30 LAB — LIPID PANEL
Cholesterol: 123 mg/dL (ref <200)
HDL: 56 mg/dL (ref >=40)
LDL Cholesterol (Calc): 49 mg/dL (calc)
Non-HDL Cholesterol (Calc): 67 mg/dL (calc) (ref <130)
Total CHOL/HDL Ratio: 2.2 (calc) (ref <5.0)
Triglycerides: 97 mg/dL (ref <150)

## 2020-04-30 LAB — LDL CHOLESTEROL, DIRECT: Direct LDL: 60 mg/dL (ref <100)

## 2020-05-03 DIAGNOSIS — S46212A Strain of muscle, fascia and tendon of other parts of biceps, left arm, initial encounter: Secondary | ICD-10-CM | POA: Diagnosis not present

## 2020-05-13 DIAGNOSIS — H00012 Hordeolum externum right lower eyelid: Secondary | ICD-10-CM | POA: Diagnosis not present

## 2020-05-25 ENCOUNTER — Other Ambulatory Visit: Payer: Self-pay | Admitting: Cardiology

## 2020-07-01 ENCOUNTER — Other Ambulatory Visit: Payer: Self-pay

## 2020-07-02 ENCOUNTER — Ambulatory Visit (INDEPENDENT_AMBULATORY_CARE_PROVIDER_SITE_OTHER): Payer: Medicare Other | Admitting: Family Medicine

## 2020-07-02 ENCOUNTER — Encounter: Payer: Self-pay | Admitting: Family Medicine

## 2020-07-02 VITALS — BP 116/70 | HR 57 | Temp 98.2°F | Ht 74.0 in | Wt 231.4 lb

## 2020-07-02 DIAGNOSIS — K219 Gastro-esophageal reflux disease without esophagitis: Secondary | ICD-10-CM

## 2020-07-02 DIAGNOSIS — W57XXXA Bitten or stung by nonvenomous insect and other nonvenomous arthropods, initial encounter: Secondary | ICD-10-CM

## 2020-07-02 DIAGNOSIS — S80869A Insect bite (nonvenomous), unspecified lower leg, initial encounter: Secondary | ICD-10-CM

## 2020-07-02 MED ORDER — LANSOPRAZOLE 30 MG PO CPDR: 30.0000 mg | DELAYED_RELEASE_CAPSULE | Freq: Every day | ORAL | 3 refills | Status: DC

## 2020-07-02 NOTE — Progress Notes (Signed)
[facility] [address] [place] [address] Dept: [phone] Dept Fax: [phone]  Office Visit  Subjective:    Patient ID: Jeffrey Poole, male    DOB: December 04, 1950, 70 y.o..   MRN: [id number]  Chief Complaint  Patient presents with   Acute Visit    C/o having red area around the area wher her removed a couple ticks x 5 days ago.  Would like to get blood work for Lyme's Disease. Also wants refill on Prevacid.    History of Present Illness:  Patient is in today with concerns for Lyme disease related to two recent tick bites. Mr. Jeffrey Poole notes both bites occurred about 5 days ago. They were on either calf area. The ticks were attached. The one on the left came off fairly easily, but he had to dig at the one on the right. He has had some localized itchiness at the site since then. He has noticed some recent indigestion, diarrhea and a sense of fatigue. He has not noted any fever. Jeffrey Poole had an aunt that had Lyme disease and suffered severe consequences, so has some worries about this.  Also, Jeffrey Poole has a history of GERD. He has been on PPIs for 30+ years. He had seen a commercial recently related to his PPI, Prevacid, being associated with cancer and asked about this.  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 90 tablet 3   nitroGLYCERIN (NITROSTAT) 0.4 MG SL tablet Place 1 tablet (0.4 mg total) under the tongue every 5 (five) minutes as needed. 25 tablet 3   rosuvastatin (CRESTOR) 5 MG tablet TAKE 1 TABLET BY MOUTH ONCE DAILY AT  6  P.M. (Patient taking differently: TAKE 1 TABLET BY MOUTH ONCE DAILY AT  6  P.M.) 90 tablet 1   lansoprazole (PREVACID) 30 MG capsule Take 1 capsule by mouth once daily 30 capsule 0   No facility-administered medications prior to visit.   No Known Allergies    Objective:   Today's Vitals   07/02/20 1335  BP: 116/70  Pulse: (!) 57  Temp: 98.2 F (36.8 C)  TempSrc: Temporal  SpO2: 94%  Weight: 231 lb 6.4 oz (105 kg)  Height: 6\' 2"  (1.88 m)   Body mass index is 29.71 kg/m.   General: Well developed, well nourished. No acute distress. Skin: Warm and dry. There is a 5 mm area of induration and redness over the  left posterior calf. There is no   surrounding rash and no drainage. There is a larger 0.5 x 1 cm eschar over the right posteriolateral calf.   There is no sign of erythema and no other rash. Psych: Alert and oriented. Normal mood and affect.  Health Maintenance Due  Topic Date Due   COVID-19 Vaccine (1) Never done   TETANUS/TDAP  Never done   Zoster Vaccines- Shingrix (1 of 2) Never done   PNA vac Low Risk Adult (2 of 2 - PPSV23) 12/04/2017     Assessment & Plan:   1. Tick bite of lower leg, unspecified laterality, initial encounter I reassured Jeffrey Poole that he is currently at low risk for having Lyme disease in light o no erythema migrans being present. He remains concerned, so I will plan  serial testing for Lyme. If his initial test is negative, we will repeat this in 3 weeks.  - Lyme Disease Serology w/Reflex  2. Gastroesophageal reflux disease I explained that all PPIs have been associated with gastric tumors, esp. in patients taking these long term. We discussed other options such as switching to a H2 blocker, stopping PPIs, or evaluation for a possible Nissen procedure. He does not want to take any of these approaches at this time and would like his PPI renewed.  - lansoprazole (PREVACID) 30 MG capsule; Take 1 capsule (30 mg total) by mouth daily.  Dispense: 90 capsule; Refill: 3  Jeffrey Salter, MD

## 2020-07-05 ENCOUNTER — Other Ambulatory Visit: Payer: Self-pay | Admitting: Cardiology

## 2020-07-05 LAB — LYME DISEASE SEROLOGY W/REFLEX: Lyme Total Antibody EIA: NEGATIVE

## 2020-07-05 NOTE — Telephone Encounter (Signed)
Refill sent to pharmacy.   Needs appointment for further refills.  

## 2020-07-05 NOTE — Telephone Encounter (Signed)
Refill sent to pharmacy.   

## 2020-07-05 NOTE — Addendum Note (Signed)
Addended by: Haydee Salter on: 07/05/2020 05:29 PM   Modules accepted: Orders

## 2020-07-22 ENCOUNTER — Other Ambulatory Visit (INDEPENDENT_AMBULATORY_CARE_PROVIDER_SITE_OTHER): Payer: Medicare Other

## 2020-07-22 ENCOUNTER — Other Ambulatory Visit: Payer: Self-pay

## 2020-07-22 DIAGNOSIS — S80869A Insect bite (nonvenomous), unspecified lower leg, initial encounter: Secondary | ICD-10-CM | POA: Diagnosis not present

## 2020-07-22 DIAGNOSIS — W57XXXA Bitten or stung by nonvenomous insect and other nonvenomous arthropods, initial encounter: Secondary | ICD-10-CM

## 2020-07-22 NOTE — Progress Notes (Signed)
Per the orders of  Dr.Kremer pt is here for labs, pt tolerated draw well. ? ?

## 2020-07-26 LAB — LYME DISEASE SEROLOGY W/REFLEX: Lyme Total Antibody EIA: NEGATIVE

## 2020-09-01 ENCOUNTER — Other Ambulatory Visit: Payer: Self-pay | Admitting: Cardiology

## 2020-09-16 ENCOUNTER — Ambulatory Visit (INDEPENDENT_AMBULATORY_CARE_PROVIDER_SITE_OTHER): Payer: Medicare Other | Admitting: Cardiology

## 2020-09-16 ENCOUNTER — Other Ambulatory Visit: Payer: Self-pay

## 2020-09-16 ENCOUNTER — Encounter: Payer: Self-pay | Admitting: Cardiology

## 2020-09-16 VITALS — BP 146/84 | HR 53 | Ht 74.0 in | Wt 229.0 lb

## 2020-09-16 DIAGNOSIS — E782 Mixed hyperlipidemia: Secondary | ICD-10-CM | POA: Diagnosis not present

## 2020-09-16 DIAGNOSIS — I7789 Other specified disorders of arteries and arterioles: Secondary | ICD-10-CM

## 2020-09-16 DIAGNOSIS — I25118 Atherosclerotic heart disease of native coronary artery with other forms of angina pectoris: Secondary | ICD-10-CM | POA: Diagnosis not present

## 2020-09-16 DIAGNOSIS — I10 Essential (primary) hypertension: Secondary | ICD-10-CM | POA: Diagnosis not present

## 2020-09-16 NOTE — Patient Instructions (Signed)

## 2020-09-16 NOTE — Progress Notes (Signed)
Cardiology Office Note:    Date:  09/16/2020   ID:  Jeffrey Poole, DOB [birth year], MRN [id number]  PCP:  Libby Maw, MD  Cardiologist:  Shirlee More, MD    Referring MD: Libby Maw,*    ASSESSMENT:    1. Coronary artery disease of native artery of native heart with stable angina pectoris ([place])   2. Essential hypertension   3. Mixed hyperlipidemia   4. Enlarged thoracic aorta ([facility])    PLAN:    In order of problems listed above:  He is doing well with coronary artery disease asymptomatic New York Heart Association class I no angina compliant with we will continue aspirin high intensity statin calcium channel blocker and nitroglycerin if needed.  Although I advised him to undergo coronary angiography for severe flow-limiting stenosis LAD he has declined and is chosen symptom guided treatment.  I asked him if he needs to take nitroglycerin to let me know and otherwise I will plan to see him back in the office in 1 year Stable continue current treatment Compliant with lipids at target continue with statin I will plan to  follow-up noncontrast CT in 1 year to follow his aorta   Next appointment: 1 year   Medication Adjustments/Labs and Tests Ordered: Current medicines are reviewed at length with the patient today.  Concerns regarding medicines are outlined above.  Orders Placed This Encounter  Procedures   EKG 12-Lead   No orders of the defined types were placed in this encounter.   Chief Complaint  Patient presents with   Follow-up   Coronary Artery Disease    History of Present Illness:    Jeffrey Poole is a 70 y.o. male with a hx of CAD enlargement of thoracic aorta hypertension and hyper lipidemia.  Echocardiogram in June 2021 showed ascending aorta moderately enlarged 39 mm.  Cardiac CTA showed severe obstructive LAD disease he declined left heart catheterization intervention and elected medical treatment.  He was last seen 08/29/2019. Compliance  with diet, lifestyle and medications: Yes  [name] is pleased with the quality of his life.  He runs 3 to 4 miles 3 days a week very very active and has had no exercise intolerance chest pain shortness of breath edema palpitations syncope or side effects from his medications. 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 APPOINTMENT REQUIRED FOR FUTURE REFILLS   aspirin EC 81 MG tablet Take 1 tablet (81 mg total) by mouth daily.   lansoprazole (PREVACID) 30 MG capsule Take 1 capsule (30 mg total) by mouth daily.   nitroGLYCERIN (NITROSTAT) 0.4 MG SL tablet Place 1 tablet (0.4 mg total) under the tongue every 5 (five) minutes as needed.  rosuvastatin (CRESTOR) 5 MG tablet TAKE 1 TABLET BY MOUTH ONCE DAILY 6 IN THE EVENING     Allergies:   Patient has no known allergies.   Social History   Socioeconomic History   Marital status: Married    Spouse name: Not on file   Number of children: Not on file   Years of education: Not on file   Highest education level: Not on file  Occupational History   Not on file  Tobacco Use   Smoking status: Never   Smokeless tobacco: Never  Vaping Use   Vaping Use: Never used   Substance and Sexual Activity   Alcohol use: Never   Drug use: Never   Sexual activity: Not on file  Other Topics Concern   Not on file  Social History Narrative   Not on file   Social Determinants of Health   Financial Resource Strain: Not on file  Food Insecurity: Not on file  Transportation Needs: Not on file  Physical Activity: Not on file  Stress: Not on file  Social Connections: Not on file     Family History: The patient's family history includes Heart Problems in his maternal uncle; Hypertension in his brother and mother. ROS:   Please see the history of present illness.    All other systems reviewed and are negative.  EKGs/Labs/Other Studies Reviewed:    The following studies were reviewed today:  EKG:  EKG ordered today and personally reviewed.  The ekg ordered today demonstrates sinus bradycardia 53 bpm normal EKG  Recent Labs: 04/29/2020: ALT 18; BUN 10; Creat 0.90; Hemoglobin 16.1; Platelets 229; Potassium 4.2; Sodium 141  Recent Lipid Panel    Component Value Date/Time   CHOL 123 04/29/2020 1449   CHOL 119 05/21/2019 1626   TRIG 97 04/29/2020 1449   HDL 56 04/29/2020 1449   HDL 48 05/21/2019 1626   CHOLHDL 2.2 04/29/2020 1449   VLDL 26.0 05/07/2017 1608   LDLCALC 49 04/29/2020 1449   LDLDIRECT 60 04/29/2020 1449    Physical Exam:    VS:  BP (!) 146/84 (BP Location: Right Arm, Patient Position: Sitting)   Pulse (!) 53   Ht '6\' 2"'$  (1.88 m)   Wt 229 lb 0.6 oz (103.9 kg)   SpO2 95%   BMI 29.41 kg/m     Wt Readings from Last 3 Encounters:  09/16/20 229 lb 0.6 oz (103.9 kg)  07/02/20 231 lb 6.4 oz (105 kg)  04/29/20 231 lb 3.2 oz (104.9 kg)     GEN: He looks healthy well nourished, well developed in no acute distress HEENT: Normal NECK: No JVD; No carotid bruits LYMPHATICS: No lymphadenopathy CARDIAC: RRR, no murmurs, rubs, gallops RESPIRATORY:  Clear to auscultation without rales, wheezing or rhonchi  ABDOMEN: Soft, non-tender,  non-distended MUSCULOSKELETAL:  No edema; No deformity  SKIN: Warm and dry NEUROLOGIC:  Alert and oriented x 3 PSYCHIATRIC:  Normal affect    Signed, Shirlee More, MD  09/16/2020 4:36 PM    [facility]

## 2020-10-02 ENCOUNTER — Telehealth: Payer: Self-pay

## 2020-10-02 NOTE — Telephone Encounter (Signed)
Lft VM to rtn call to schedule an AWV. Dm/cma

## 2020-10-18 ENCOUNTER — Other Ambulatory Visit: Payer: Self-pay

## 2020-10-18 ENCOUNTER — Other Ambulatory Visit: Payer: Self-pay | Admitting: Cardiology

## 2020-10-18 MED ORDER — AMLODIPINE BESYLATE 10 MG PO TABS: 10.0000 mg | ORAL_TABLET | Freq: Every day | ORAL | 3 refills | Status: DC

## 2020-10-28 DIAGNOSIS — K629 Disease of anus and rectum, unspecified: Secondary | ICD-10-CM | POA: Diagnosis not present

## 2020-12-20 DIAGNOSIS — H31093 Other chorioretinal scars, bilateral: Secondary | ICD-10-CM | POA: Diagnosis not present

## 2020-12-20 DIAGNOSIS — H35362 Drusen (degenerative) of macula, left eye: Secondary | ICD-10-CM | POA: Diagnosis not present

## 2020-12-20 DIAGNOSIS — H35373 Puckering of macula, bilateral: Secondary | ICD-10-CM | POA: Diagnosis not present

## 2020-12-20 DIAGNOSIS — H43812 Vitreous degeneration, left eye: Secondary | ICD-10-CM | POA: Diagnosis not present

## 2021-05-04 ENCOUNTER — Ambulatory Visit (INDEPENDENT_AMBULATORY_CARE_PROVIDER_SITE_OTHER): Payer: Medicare Other

## 2021-05-04 DIAGNOSIS — Z0000 Encounter for general adult medical examination without abnormal findings: Secondary | ICD-10-CM | POA: Diagnosis not present

## 2021-05-04 NOTE — Progress Notes (Addendum)
Reviewed Jeffrey Poole ? ?Subjective:  ? Jeffrey Poole is a 71 y.o. male who presents for an Initial Medicare Annual Wellness Visit. ? ?I connected with Jeffrey Poole today by telephone and verified that I am speaking with the correct person using two identifiers. ?Location patient: home ?Location provider: work ?Persons participating in the virtual visit: patient, provider. ?  ?I discussed the limitations, risks, security and privacy concerns of performing an evaluation and management service by telephone and the availability of in person appointments. I also discussed with the patient that there may be a patient responsible charge related to this service. The patient expressed understanding and verbally consented to this telephonic visit.  ?  ?Interactive audio and video telecommunications were attempted between this provider and patient, however failed, due to patient having technical difficulties OR patient did not have access to video capability.  We continued and completed visit with audio only. ? ?  ?Review of Systems    ? ?Cardiac Risk Factors include: advanced age (>54mn, >>7women);male gender;dyslipidemia ? ?   ?Objective:  ?  ?Today's Vitals  ? ?There is no height or weight on file to calculate BMI. ? ? ?  05/04/2021  ?  3:20 PM 08/01/2019  ?  4:21 PM  ?Advanced Directives  ?Does Patient Have a Medical Advance Directive? Yes No  ?Type of [place]   ?Copy of [place]? No - copy requested   ? ? ?Current Medications (verified) ?Outpatient Encounter Medications as of 05/04/2021  ?Medication Sig  ? aspirin EC 81 MG tablet Take 1 tablet (81 mg total) by mouth Poole.  ? lansoprazole (PREVACID) 30 MG capsule Take 1 capsule (30 mg total) by mouth Poole.  ? nitroGLYCERIN (NITROSTAT) 0.4 MG SL tablet Place 1 tablet (0.4 mg total) under the tongue every 5 (five) minutes as needed.  ? amLODipine (NORVASC) 10 MG tablet Take 1 tablet (10 mg total)  by mouth Poole. (Patient not taking: Reported on 05/04/2021)  ? rosuvastatin (CRESTOR) 5 MG tablet TAKE 1 TABLET BY MOUTH ONCE Poole 6 IN THE EVENING (Patient not taking: Reported on 05/04/2021)  ? ?No facility-administered encounter medications on file as of 05/04/2021.  ? ? ?Allergies (verified) ?Patient has no known allergies.  ? ?History: ?Past Medical History:  ?Diagnosis Date  ? 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 Coronary artery disease   ? Encounter for health maintenance examination with abnormal findings 05/07/2017  ? Fibromyalgia 06/24/2018  ? Gastroesophageal reflux disease 05/07/2017  ? Hypertension 06/24/2018  ? 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 Palpitations 06/25/2018  ? 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 Trigger finger of all digits of both hands 05/07/2017  ? ?Past Surgical History:  ?Procedure Laterality Date  ? BACK SURGERY    ? RETINAL DETACHMENT SURGERY    ? ?Family History  ?Problem Relation Age of Onset  ? Hypertension Mother   ? Hypertension Brother   ? Heart Problems Maternal Uncle   ? ?Social History  ? ?Socioeconomic History  ? Marital status: Married  ?  Spouse name: Not on file  ? Number of children: Not on file  ? Years of education: Not on file  ? Highest education level: Not on file  ?Occupational History  ? Not on file  ?Tobacco Use  ? Smoking status: Never  ? Smokeless tobacco: Never  ?Vaping Use  ? Vaping Use: Never used  ?Substance and Sexual Activity  ? Alcohol  use: Never  ? Drug use: Never  ? Sexual activity: Not on file  ?Other Topics Concern  ? Not on file  ?Social History Narrative  ? Not on file  ? ?Social Determinants of Health  ? ?Financial Resource Strain: Low Risk   ? Difficulty of Paying Living Expenses: Not hard at all  ?Food Insecurity: No Food Insecurity  ? Worried About Charity fundraiser in the Last Year: Never true  ? Ran Out of Food in the Last Year: Never true  ?Transportation Needs: No Transportation Needs  ? Lack of Transportation (Medical): No  ?  Lack of Transportation (Non-Medical): No  ?Physical Activity: Sufficiently Active  ? Days of Exercise per Week: 4 days  ? Minutes of Exercise per Session: 40 min  ?Stress: No Stress Concern Present  ? Feeling of Stress : Not at all  ?Social Connections: Moderately Integrated  ? Frequency of Communication with Friends and Family: Twice a week  ? Frequency of Social Gatherings with Friends and Family: Twice a week  ? Attends Religious Services: More than 4 times per year  ? Active Member of Clubs or Organizations: No  ? Attends Archivist Meetings: Never  ? Marital Status: Married  ? ? ?Tobacco Counseling ?Counseling given: Not Answered ? ? ?Clinical Intake: ? ?Pre-visit preparation completed: Yes ? ?Pain : No/denies pain ? ?  ? ?Nutritional Risks: None ?Diabetes: No ? ?How often do you need to have someone help you when you read instructions, pamphlets, or other written materials from your doctor or pharmacy?: 1 - Never ?What is the last grade level you completed in school?: college ? ?Diabetic?no  ? ?Interpreter Needed?: No ? ?Information entered by :: L.Katye Valek,LPn ? ? ?Activities of [place] Living ? ?  05/04/2021  ?  3:23 PM  ?In your present state of health, do you have any difficulty performing the following activities:  ?Hearing? 0  ?Vision? 0  ?Difficulty concentrating or making decisions? 0  ?Walking or climbing stairs? 0  ?Dressing or bathing? 0  ?Doing errands, shopping? 0  ?Preparing Food and eating ? N  ?Using the Toilet? N  ?In the past six months, have you accidently leaked urine? N  ?Do you have problems with loss of bowel control? N  ?Managing your Medications? N  ?Managing your Finances? N  ?Housekeeping or managing your Housekeeping? N  ? ? ?Patient Care Team: ?Libby Maw, MD as PCP - General (Family Medicine) ?Richardo Priest, MD as PCP - Cardiology (Cardiology) ? ?Indicate any recent Medical Services you may have received from other than [facility] providers in the past year (date may  be approximate). ? ?   ?Assessment:  ? This is a routine wellness examination for Jeffrey Poole. ? ?Hearing/Vision screen ?Vision Screening - Comments:: Annual eye exams wear glasses  ? ?Dietary issues and exercise activities discussed: ?Current Exercise Habits: Home exercise routine, Type of exercise: walking, Time (Minutes): 40, Frequency (Times/Week): 4, Weekly Exercise (Minutes/Week): 160, Intensity: Mild, Exercise limited by: cardiac condition(s) ? ? Goals Addressed   ?None ?  ? ?Depression Screen ? ?  05/04/2021  ?  3:21 PM 05/04/2021  ?  3:18 PM 04/29/2020  ?  2:07 PM  ?PHQ 2/9 Scores  ?PHQ - 2 Score 0 0 0  ?  ?Fall Risk ? ?  05/04/2021  ?  3:20 PM 04/29/2020  ?  2:07 PM  ?Fall Risk   ?Falls in the past year? 0 0  ?Number falls in past  yr: 0   ?Injury with Fall? 0   ?Follow up Falls evaluation completed   ? ? ?FALL RISK PREVENTION PERTAINING TO THE HOME: ? ?Any stairs in or around the home? Yes  ?If so, are there any without handrails? No  ?Home free of loose throw rugs in walkways, pet beds, electrical cords, etc? Yes  ?Adequate lighting in your home to reduce risk of falls? Yes  ? ?ASSISTIVE DEVICES UTILIZED TO PREVENT FALLS: ? ?Life alert? No  ?Use of a cane, walker or w/c? No  ?Grab bars in the bathroom? No  ?Shower chair or bench in shower? No  ?Elevated toilet seat or a handicapped toilet? No  ? ?Cognitive Function: ?  ? Normal cognitive status assessed by telephone conversation  by this Nurse Health Advisor. No abnormalities found.  ? ?  ? ?Immunizations ?Immunization History  ?Administered Date(s) Administered  ? Influenza, High Dose Seasonal PF 12/04/2016  ? Pneumococcal Conjugate-13 12/04/2016  ? ? ?TDAP status: Due, Education has been provided regarding the importance of this vaccine. Advised may receive this vaccine at local pharmacy or Health Dept. Aware to provide a copy of the vaccination record if obtained from local pharmacy or Health Dept. Verbalized acceptance and understanding. ? ?Flu Vaccine status:  Declined, Education has been provided regarding the importance of this vaccine but patient still declined. Advised may receive this vaccine at local pharmacy or Health Dept. Aware to provide a copy of th

## 2021-05-04 NOTE — Patient Instructions (Signed)
Jeffrey Poole , ?Thank you for taking time to come for your Medicare Wellness Visit. I appreciate your ongoing commitment to your health goals. Please review the following plan we discussed and let me know if I can assist you in the future.  ? ?Screening recommendations/referrals: ?Colonoscopy: 010/01/2015  due 2027 ?Recommended yearly ophthalmology/optometry visit for glaucoma screening and checkup ?Recommended yearly dental visit for hygiene and checkup ? ?Vaccinations: ?Influenza vaccine: declined  ?Pneumococcal vaccine: completed  ?Tdap vaccine: due  ?Shingles vaccine: will consider    ? ?Advanced directives: yes  ? ?Conditions/risks identified: none  ? ?Next appointment: none  ? ?Preventive Care 13 Years and Older, Male ?Preventive care refers to lifestyle choices and visits with your health care provider that can promote health and wellness. ?What does preventive care include? ?A yearly physical exam. This is also called an annual well check. ?Dental exams once or twice a year. ?Routine eye exams. Ask your health care provider how often you should have your eyes checked. ?Personal lifestyle choices, including: ?Daily care of your teeth and gums. ?Regular physical activity. ?Eating a healthy diet. ?Avoiding tobacco and drug use. ?Limiting alcohol use. ?Practicing safe sex. ?Taking low doses of aspirin every day. ?Taking vitamin and mineral supplements as recommended by your health care provider. ?What happens during an annual well check? ?The services and screenings done by your health care provider during your annual well check will depend on your age, overall health, lifestyle risk factors, and family history of disease. ?Counseling  ?Your health care provider may ask you questions about your: ?Alcohol use. ?Tobacco use. ?Drug use. ?Emotional well-being. ?Home and relationship well-being. ?Sexual activity. ?Eating habits. ?History of falls. ?Memory and ability to understand (cognition). ?Work and work  Statistician. ?Screening  ?You may have the following tests or measurements: ?Height, weight, and BMI. ?Blood pressure. ?Lipid and cholesterol levels. These may be checked every 5 years, or more frequently if you are over 42 years old. ?Skin check. ?Lung cancer screening. You may have this screening every year starting at age 5 if you have a 30-pack-year history of smoking and currently smoke or have quit within the past 15 years. ?Fecal occult blood test (FOBT) of the stool. You may have this test every year starting at age 39. ?Flexible sigmoidoscopy or colonoscopy. You may have a sigmoidoscopy every 5 years or a colonoscopy every 10 years starting at age 78. ?Prostate cancer screening. Recommendations will vary depending on your family history and other risks. ?Hepatitis C blood test. ?Hepatitis B blood test. ?Sexually transmitted disease (STD) testing. ?Diabetes screening. This is done by checking your blood sugar (glucose) after you have not eaten for a while (fasting). You may have this done every 1-3 years. ?Abdominal aortic aneurysm (AAA) screening. You may need this if you are a current or former smoker. ?Osteoporosis. You may be screened starting at age 20 if you are at high risk. ?Talk with your health care provider about your test results, treatment options, and if necessary, the need for more tests. ?Vaccines  ?Your health care provider may recommend certain vaccines, such as: ?Influenza vaccine. This is recommended every year. ?Tetanus, diphtheria, and acellular pertussis (Tdap, Td) vaccine. You may need a Td booster every 10 years. ?Zoster vaccine. You may need this after age 16. ?Pneumococcal 13-valent conjugate (PCV13) vaccine. One dose is recommended after age 54. ?Pneumococcal polysaccharide (PPSV23) vaccine. One dose is recommended after age 77. ?Talk to your health care provider about which screenings and vaccines you need  and how often you need them. ?This information is not intended to replace  advice given to you by your health care provider. Make sure you discuss any questions you have with your health care provider. ?Document Released: 01/29/2015 Document Revised: 09/22/2015 Document Reviewed: 11/03/2014 ?Elsevier Interactive Patient Education ? 2017 [place]. ? ?Fall Prevention in the Home ?Falls can cause injuries. They can happen to people of all ages. There are many things you can do to make your home safe and to help prevent falls. ?What can I do on the outside of my home? ?Regularly fix the edges of walkways and driveways and fix any cracks. ?Remove anything that might make you trip as you walk through a door, such as a raised step or threshold. ?Trim any bushes or trees on the path to your home. ?Use bright outdoor lighting. ?Clear any walking paths of anything that might make someone trip, such as rocks or tools. ?Regularly check to see if handrails are loose or broken. Make sure that both sides of any steps have handrails. ?Any raised decks and porches should have guardrails on the edges. ?Have any leaves, snow, or ice cleared regularly. ?Use sand or salt on walking paths during winter. ?Clean up any spills in your garage right away. This includes oil or grease spills. ?What can I do in the bathroom? ?Use night lights. ?Install grab bars by the toilet and in the tub and shower. Do not use towel bars as grab bars. ?Use non-skid mats or decals in the tub or shower. ?If you need to sit down in the shower, use a plastic, non-slip stool. ?Keep the floor dry. Clean up any water that spills on the floor as soon as it happens. ?Remove soap buildup in the tub or shower regularly. ?Attach bath mats securely with double-sided non-slip rug tape. ?Do not have throw rugs and other things on the floor that can make you trip. ?What can I do in the bedroom? ?Use night lights. ?Make sure that you have a light by your bed that is easy to reach. ?Do not use any sheets or blankets that are too big for your bed.  They should not hang down onto the floor. ?Have a firm chair that has side arms. You can use this for support while you get dressed. ?Do not have throw rugs and other things on the floor that can make you trip. ?What can I do in the kitchen? ?Clean up any spills right away. ?Avoid walking on wet floors. ?Keep items that you use a lot in easy-to-reach places. ?If you need to reach something above you, use a strong step stool that has a grab bar. ?Keep electrical cords out of the way. ?Do not use floor polish or wax that makes floors slippery. If you must use wax, use non-skid floor wax. ?Do not have throw rugs and other things on the floor that can make you trip. ?What can I do with my stairs? ?Do not leave any items on the stairs. ?Make sure that there are handrails on both sides of the stairs and use them. Fix handrails that are broken or loose. Make sure that handrails are as long as the stairways. ?Check any carpeting to make sure that it is firmly attached to the stairs. Fix any carpet that is loose or worn. ?Avoid having throw rugs at the top or bottom of the stairs. If you do have throw rugs, attach them to the floor with carpet tape. ?Make sure that you  have a light switch at the top of the stairs and the bottom of the stairs. If you do not have them, ask someone to add them for you. ?What else can I do to help prevent falls? ?Wear shoes that: ?Do not have high heels. ?Have rubber bottoms. ?Are comfortable and fit you well. ?Are closed at the toe. Do not wear sandals. ?If you use a stepladder: ?Make sure that it is fully opened. Do not climb a closed stepladder. ?Make sure that both sides of the stepladder are locked into place. ?Ask someone to hold it for you, if possible. ?Clearly mark and make sure that you can see: ?Any grab bars or handrails. ?First and last steps. ?Where the edge of each step is. ?Use tools that help you move around (mobility aids) if they are needed. These  include: ?Canes. ?Walkers. ?Scooters. ?Crutches. ?Turn on the lights when you go into a dark area. Replace any light bulbs as soon as they burn out. ?Set up your furniture so you have a clear path. Avoid moving your furniture around. ?If a

## 2021-06-09 ENCOUNTER — Telehealth: Payer: Self-pay | Admitting: Cardiology

## 2021-06-09 NOTE — Telephone Encounter (Signed)
Patient is requesting an order for a CT. He would like a callback to schedule once order is placed.

## 2021-06-10 NOTE — Telephone Encounter (Signed)
Left message for patient to call back  

## 2021-06-14 NOTE — Telephone Encounter (Signed)
Left message for patient to call back  

## 2021-06-15 NOTE — Telephone Encounter (Signed)
Left message for patient to call back  

## 2021-06-16 NOTE — Telephone Encounter (Signed)
Left message for patient to call back  

## 2021-06-29 NOTE — Telephone Encounter (Signed)
Attempted to call the patient several times to discuss his request to have a CT ordered. Patient did not respond to our calls. A letter was sent to the patient.

## 2021-08-09 ENCOUNTER — Telehealth: Payer: Self-pay | Admitting: Cardiology

## 2021-08-09 DIAGNOSIS — I25118 Atherosclerotic heart disease of native coronary artery with other forms of angina pectoris: Secondary | ICD-10-CM

## 2021-08-09 DIAGNOSIS — R931 Abnormal findings on diagnostic imaging of heart and coronary circulation: Secondary | ICD-10-CM

## 2021-08-09 DIAGNOSIS — I7789 Other specified disorders of arteries and arterioles: Secondary | ICD-10-CM

## 2021-08-09 NOTE — Telephone Encounter (Signed)
Patient calling in bout having a CT done. Please advise

## 2021-08-15 NOTE — Telephone Encounter (Signed)
Left message for the patient to call back.

## 2021-08-16 DIAGNOSIS — M25561 Pain in right knee: Secondary | ICD-10-CM | POA: Diagnosis not present

## 2021-08-18 NOTE — Telephone Encounter (Signed)
Patient returned call

## 2021-08-24 ENCOUNTER — Other Ambulatory Visit: Payer: Self-pay | Admitting: Family Medicine

## 2021-08-24 DIAGNOSIS — K219 Gastro-esophageal reflux disease without esophagitis: Secondary | ICD-10-CM

## 2021-08-25 ENCOUNTER — Other Ambulatory Visit: Payer: Self-pay

## 2021-08-25 DIAGNOSIS — K219 Gastro-esophageal reflux disease without esophagitis: Secondary | ICD-10-CM

## 2021-08-25 MED ORDER — LANSOPRAZOLE 30 MG PO CPDR: 30.0000 mg | DELAYED_RELEASE_CAPSULE | Freq: Every day | ORAL | 0 refills | Status: DC

## 2021-09-02 NOTE — Telephone Encounter (Signed)
Called [phone] for [facility] per DPR and left VM to callback. CT order has been placed for [employer].  Call to schedule: [facility] [address] [place], [place] [address] [phone]  Called [phone] for pt but mailbox is full.

## 2021-09-03 ENCOUNTER — Ambulatory Visit (HOSPITAL_BASED_OUTPATIENT_CLINIC_OR_DEPARTMENT_OTHER)
Admission: RE | Admit: 2021-09-03 | Discharge: 2021-09-03 | Disposition: A | Discharge status: Home / Self Care | Payer: Medicare Other | Source: Ambulatory Visit | Attending: Cardiology | Admitting: Cardiology

## 2021-09-03 DIAGNOSIS — I7789 Other specified disorders of arteries and arterioles: Secondary | ICD-10-CM | POA: Insufficient documentation

## 2021-09-03 DIAGNOSIS — I2510 Atherosclerotic heart disease of native coronary artery without angina pectoris: Secondary | ICD-10-CM | POA: Diagnosis not present

## 2021-09-03 DIAGNOSIS — R931 Abnormal findings on diagnostic imaging of heart and coronary circulation: Secondary | ICD-10-CM | POA: Diagnosis not present

## 2021-09-03 DIAGNOSIS — I25118 Atherosclerotic heart disease of native coronary artery with other forms of angina pectoris: Secondary | ICD-10-CM | POA: Diagnosis not present

## 2021-09-06 NOTE — Telephone Encounter (Signed)
CT chest has been completed.

## 2021-09-08 ENCOUNTER — Telehealth: Payer: Self-pay

## 2021-09-08 NOTE — Telephone Encounter (Signed)
Patient notified of results.

## 2021-09-08 NOTE — Telephone Encounter (Signed)
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  That will be discussed with Dr. Bettina Gavia during the visit.  No need to do anything urgently

## 2021-09-23 ENCOUNTER — Ambulatory Visit (INDEPENDENT_AMBULATORY_CARE_PROVIDER_SITE_OTHER): Payer: Medicare Other | Admitting: Family Medicine

## 2021-09-23 ENCOUNTER — Encounter: Payer: Self-pay | Admitting: Family Medicine

## 2021-09-23 VITALS — BP 128/78 | HR 70 | Temp 97.3°F | Ht 74.0 in | Wt 230.2 lb

## 2021-09-23 DIAGNOSIS — Z0000 Encounter for general adult medical examination without abnormal findings: Secondary | ICD-10-CM

## 2021-09-23 DIAGNOSIS — R351 Nocturia: Secondary | ICD-10-CM | POA: Diagnosis not present

## 2021-09-23 DIAGNOSIS — I2583 Coronary atherosclerosis due to lipid rich plaque: Secondary | ICD-10-CM

## 2021-09-23 DIAGNOSIS — T887XXA Unspecified adverse effect of drug or medicament, initial encounter: Secondary | ICD-10-CM

## 2021-09-23 DIAGNOSIS — K219 Gastro-esophageal reflux disease without esophagitis: Secondary | ICD-10-CM

## 2021-09-23 DIAGNOSIS — R5383 Other fatigue: Secondary | ICD-10-CM | POA: Diagnosis not present

## 2021-09-23 DIAGNOSIS — I2510 Atherosclerotic heart disease of native coronary artery without angina pectoris: Secondary | ICD-10-CM

## 2021-09-23 MED ORDER — LANSOPRAZOLE 30 MG PO CPDR: 30.0000 mg | DELAYED_RELEASE_CAPSULE | Freq: Every day | ORAL | 0 refills | Status: DC

## 2021-09-23 NOTE — Progress Notes (Signed)
Established Patient Office Visit  Subjective   Patient ID: Jeffrey Poole, male    DOB: 03-12-1950  Age: 71 y.o. MRN: [id number]  Chief Complaint  Patient presents with   Follow-up    Routine follow up on reflux issues, patient would like labs today patient fasting also needing refills.     HPI here for for a physical and follow up of GERD, BPH with nocturia, fatigue and history of coronary artery disease.  He is fasting today.  30-year history of GERD treated with daily Prevacid.  Has tried to discontinue it but reflux promptly returns.  He had an EGD 30 years ago.  He does not smoke.  Has nocturia 2-3 times nightly.  Has not tried fluid restricting before bedtime.  Force of stream varies.  Reports some ongoing fatigue.  History of low T in the past treated with injections.  History of coronary artery disease.  He discontinued the rosuvastatin because his cholesterol has been normal.  He continues to exercise 2-3 times weekly by running and walking.  He experiences no chest pain or shortness of breath.    Review of Systems  Constitutional: Negative.   HENT: Negative.    Eyes:  Negative for blurred vision, discharge and redness.  Respiratory: Negative.    Cardiovascular: Negative.   Gastrointestinal:  Negative for abdominal pain.  Genitourinary: Negative.   Musculoskeletal: Negative.  Negative for myalgias.  Skin:  Negative for rash.  Neurological:  Negative for tingling, loss of consciousness and weakness.  Endo/Heme/Allergies:  Negative for polydipsia.      Objective:     BP 128/78 (BP Location: Left Arm, Patient Position: Sitting, Cuff Size: Normal)   Pulse 70   Temp (!) 97.3 F (36.3 C) (Temporal)   Ht '6\' 2"'$  (1.88 m)   Wt 230 lb 3.2 oz (104.4 kg)   SpO2 99%   BMI 29.56 kg/m  BP Readings from Last 3 Encounters:  09/23/21 128/78  09/16/20 (!) 146/84  07/02/20 116/70   Wt Readings from Last 3 Encounters:  09/23/21 230 lb 3.2 oz (104.4 kg)  09/16/20 229 lb 0.6 oz  (103.9 kg)  07/02/20 231 lb 6.4 oz (105 kg)      Physical Exam Constitutional:      General: He is not in acute distress.    Appearance: Normal appearance. He is not ill-appearing, toxic-appearing or diaphoretic.  HENT:     Head: Normocephalic and atraumatic.     Right Ear: External ear normal.     Left Ear: External ear normal.     Mouth/Throat:     Mouth: Mucous membranes are moist.     Pharynx: Oropharynx is clear. No oropharyngeal exudate or posterior oropharyngeal erythema.  Eyes:     General: No scleral icterus.       Right eye: No discharge.        Left eye: No discharge.     Extraocular Movements: Extraocular movements intact.     Conjunctiva/sclera: Conjunctivae normal.     Pupils: Pupils are equal, round, and reactive to light.  Cardiovascular:     Rate and Rhythm: Normal rate and regular rhythm.  Pulmonary:     Effort: Pulmonary effort is normal. No respiratory distress.     Breath sounds: Normal breath sounds.  Abdominal:     General: Bowel sounds are normal.     Tenderness: There is no abdominal tenderness. There is no guarding.  Genitourinary:    Prostate: Not enlarged, not tender and no  nodules present.     Rectum: Guaiac result negative. No mass, tenderness, anal fissure, external hemorrhoid or internal hemorrhoid. Normal anal tone.  Musculoskeletal:     Cervical back: No rigidity or tenderness.  Skin:    General: Skin is warm and dry.  Neurological:     Mental Status: He is alert and oriented to person, place, and time.  Psychiatric:        Mood and Affect: Mood normal.        Behavior: Behavior normal.      Results for orders placed or performed in visit on 09/23/21  CBC  Result Value Ref Range   WBC 6.6 3.8 - 10.8 Thousand/uL   RBC 5.83 (H) 4.20 - 5.80 Million/uL   Hemoglobin 16.4 13.2 - 17.1 g/dL   HCT 47.8 38.5 - 50.0 %   MCV 82.0 80.0 - 100.0 fL   MCH 28.1 27.0 - 33.0 pg   MCHC 34.3 32.0 - 36.0 g/dL   RDW 14.3 11.0 - 15.0 %   Platelets  255 140 - 400 Thousand/uL   MPV 12.2 7.5 - 12.5 fL  Comprehensive metabolic panel  Result Value Ref Range   Glucose, Bld 101 (H) 65 - 99 mg/dL   BUN 15 7 - 25 mg/dL   Creat 1.09 0.70 - 1.28 mg/dL   BUN/Creatinine Ratio SEE NOTE: 6 - 22 (calc)   Sodium 139 135 - 146 mmol/L   Potassium 4.0 3.5 - 5.3 mmol/L   Chloride 102 98 - 110 mmol/L   CO2 27 20 - 32 mmol/L   Calcium 10.2 8.6 - 10.3 mg/dL   Total Protein 8.2 (H) 6.1 - 8.1 g/dL   Albumin 4.9 3.6 - 5.1 g/dL   Globulin 3.3 1.9 - 3.7 g/dL (calc)   AG Ratio 1.5 1.0 - 2.5 (calc)   Total Bilirubin 1.0 0.2 - 1.2 mg/dL   Alkaline phosphatase (APISO) 83 35 - 144 U/L   AST 25 10 - 35 U/L   ALT 34 9 - 46 U/L  Lipid panel  Result Value Ref Range   Cholesterol 176 <200 mg/dL   HDL 47 > OR = 40 mg/dL   Triglycerides 133 <150 mg/dL   LDL Cholesterol (Calc) 106 (H) mg/dL (calc)   Total CHOL/HDL Ratio 3.7 <5.0 (calc)   Non-HDL Cholesterol (Calc) 129 <130 mg/dL (calc)  Magnesium  Result Value Ref Range   Magnesium 2.0 1.5 - 2.5 mg/dL  PSA  Result Value Ref Range   PSA 1.78 < OR = 4.00 ng/mL  Testosterone Total,Free,Bio, Males-([facility])  Result Value Ref Range   Testosterone 470 250 - 827 ng/dL   Albumin 4.9 3.6 - 5.1 g/dL   Sex Hormone Binding 47 22 - 77 nmol/L   Testosterone, Free 45.4 6.0 - 73.0 pg/mL   Testosterone, Bioavailable 101.3 15.0 - 150.0 ng/dL  Urinalysis, Routine w reflex microscopic  Result Value Ref Range   Color, Urine YELLOW YELLOW   APPearance CLEAR CLEAR   Specific Gravity, Urine 1.021 1.001 - 1.035   pH 5.5 5.0 - 8.0   Glucose, UA NEGATIVE NEGATIVE   Bilirubin Urine NEGATIVE NEGATIVE   Ketones, ur NEGATIVE NEGATIVE   Hgb urine dipstick NEGATIVE NEGATIVE   Protein, ur NEGATIVE NEGATIVE   Nitrite NEGATIVE NEGATIVE   Leukocytes,Ua 1+ (A) NEGATIVE   WBC, UA 0-5 0 - 5 /HPF   RBC / HPF 0-2 0 - 2 /HPF   Squamous Epithelial / LPF NONE SEEN < OR = 5 /HPF  Bacteria, UA NONE SEEN NONE SEEN /HPF   Hyaline Cast NONE  SEEN NONE SEEN /LPF      The 10-year ASCVD risk score (Arnett DK, et al., 2019) is: 21.6%    Assessment & Plan:   Problem List Items Addressed This Visit       Cardiovascular and Mediastinum   Coronary artery disease due to lipid rich plaque   Relevant Medications   rosuvastatin (CRESTOR) 10 MG tablet   Other Relevant Orders   CBC (Completed)   Comprehensive metabolic panel (Completed)   Lipid panel (Completed)     Digestive   Gastroesophageal reflux disease   Relevant Medications   lansoprazole (PREVACID) 30 MG capsule   Other Relevant Orders   Ambulatory referral to Gastroenterology     Other   Healthcare maintenance - Primary   Nocturia   Relevant Orders   PSA (Completed)   Urinalysis, Routine w reflex microscopic (Completed)   Other fatigue   Relevant Orders   TSH   Testosterone Total,Free,Bio, Males-([facility]) (Completed)   Other Visit Diagnoses     Medication side effect       Relevant Orders   Magnesium (Completed)       Return in about 3 months (around 12/23/2021).  Explained the importance of taking a statin with a past medical history of coronary artery disease.  Longstanding history of GERD with no recent history of EGD.  Probably needs colonoscopy as well.  Asked patient to fluid restrict 2 hours before bedtime.  Rechecking testosterone levels.  He had taken testosterone in the past.  Advised him to seek regular dental care with regular cleanings.  Patient declines flu vaccine.  Libby Maw, MD

## 2021-09-24 LAB — URINALYSIS, ROUTINE W REFLEX MICROSCOPIC
Bacteria, UA: NONE SEEN /HPF
Bilirubin Urine: NEGATIVE
Glucose, UA: NEGATIVE
Hgb urine dipstick: NEGATIVE
Hyaline Cast: NONE SEEN /LPF
Ketones, ur: NEGATIVE
Nitrite: NEGATIVE
Protein, ur: NEGATIVE
Specific Gravity, Urine: 1.021 (ref 1.001–1.035)
Squamous Epithelial / HPF: NONE SEEN /HPF (ref <=5)
pH: 5.5 (ref 5.0–8.0)

## 2021-09-24 LAB — COMPREHENSIVE METABOLIC PANEL
AG Ratio: 1.5 (calc) (ref 1.0–2.5)
ALT: 34 U/L (ref 9–46)
AST: 25 U/L (ref 10–35)
Albumin: 4.9 g/dL (ref 3.6–5.1)
Alkaline phosphatase (APISO): 83 U/L (ref 35–144)
BUN: 15 mg/dL (ref 7–25)
CO2: 27 mmol/L (ref 20–32)
Calcium: 10.2 mg/dL (ref 8.6–10.3)
Chloride: 102 mmol/L (ref 98–110)
Creat: 1.09 mg/dL (ref 0.70–1.28)
Globulin: 3.3 g/dL (calc) (ref 1.9–3.7)
Glucose, Bld: 101 mg/dL — ABNORMAL HIGH (ref 65–99)
Potassium: 4 mmol/L (ref 3.5–5.3)
Sodium: 139 mmol/L (ref 135–146)
Total Bilirubin: 1 mg/dL (ref 0.2–1.2)
Total Protein: 8.2 g/dL — ABNORMAL HIGH (ref 6.1–8.1)

## 2021-09-24 LAB — LIPID PANEL
Cholesterol: 176 mg/dL (ref <200)
HDL: 47 mg/dL (ref >=40)
LDL Cholesterol (Calc): 106 mg/dL (calc) — ABNORMAL HIGH
Non-HDL Cholesterol (Calc): 129 mg/dL (calc) (ref <130)
Total CHOL/HDL Ratio: 3.7 (calc) (ref <5.0)
Triglycerides: 133 mg/dL (ref <150)

## 2021-09-24 LAB — CBC
HCT: 47.8 % (ref 38.5–50.0)
Hemoglobin: 16.4 g/dL (ref 13.2–17.1)
MCH: 28.1 pg (ref 27.0–33.0)
MCHC: 34.3 g/dL (ref 32.0–36.0)
MCV: 82 fL (ref 80.0–100.0)
MPV: 12.2 fL (ref 7.5–12.5)
Platelets: 255 10*3/uL (ref 140–400)
RBC: 5.83 10*6/uL — ABNORMAL HIGH (ref 4.20–5.80)
RDW: 14.3 % (ref 11.0–15.0)
WBC: 6.6 10*3/uL (ref 3.8–10.8)

## 2021-09-24 LAB — TESTOSTERONE TOTAL,FREE,BIO, MALES
Albumin: 4.9 g/dL (ref 3.6–5.1)
Sex Hormone Binding: 47 nmol/L (ref 22–77)
Testosterone, Bioavailable: 101.3 ng/dL (ref 15.0–150.0)
Testosterone, Free: 45.4 pg/mL (ref 6.0–73.0)
Testosterone: 470 ng/dL (ref 250–827)

## 2021-09-24 LAB — PSA: PSA: 1.78 ng/mL (ref <=4.00)

## 2021-09-24 LAB — MAGNESIUM: Magnesium: 2 mg/dL (ref 1.5–2.5)

## 2021-09-26 MED ORDER — ROSUVASTATIN CALCIUM 10 MG PO TABS: 10.0000 mg | ORAL_TABLET | Freq: Every day | ORAL | 3 refills | Status: DC

## 2021-09-26 NOTE — Addendum Note (Signed)
Addended by: Jon Billings on: 09/26/2021 10:11 AM   Modules accepted: Orders

## 2021-09-28 NOTE — Progress Notes (Signed)
Cardiology Office Note:    Date:  09/29/2021   ID:  [name], DOB 1950-08-08, MRN [id number]  PCP:  Libby Maw, MD  Cardiologist:  Shirlee More, MD    Referring MD: Libby Maw,*    ASSESSMENT:    1. Coronary artery disease of native artery of native heart with stable angina pectoris ([place])   2. Mixed hyperlipidemia   3. Enlarged thoracic aorta ([place])   4. Essential hypertension    PLAN:    In order of problems listed above:  From a CAD perspective is done well he has no anginal discomfort as a prescription for nitroglycerin if needed he decided to stop his calcium channel blocker and negotiate with him go back on his statin intermittently.  I asked him if he is having anginal discomfort to let me know and the appropriate test for him would be coronary angiography at that time Resume statin 3 months check lipids By CT criteria top normal I will think we need to repeat a CT in the near future Only off antihypertensive meds BP at target   Next appointment: 1 year   Medication Adjustments/Labs and Tests Ordered: Current medicines are reviewed at length with the patient today.  Concerns regarding medicines are outlined above.  Orders Placed This Encounter  Procedures   Comp Met (CMET)   Lipid Profile   EKG 12-Lead   Meds ordered this encounter  Medications   rosuvastatin (CRESTOR) 10 MG tablet    Sig: Take 1 tablet (10 mg total) by mouth 2 (two) times a week.    Dispense:  30 tablet    Refill:  3    Chief Complaint  Patient presents with   Follow-up   Coronary Artery Disease    History of Present Illness:    Jeffrey Poole is a 71 y.o. male with a hx of  CAD enlargement of thoracic aorta hypertension and hyperlipidemia.  Echocardiogram in June 2021 showed ascending aorta borderline enlarged 39 mm.  Cardiac CTA showed severe obstructive LAD disease he declined left heart catheterization intervention and elected medical treatment last seen  09/16/2020.  He had a follow-up CT of his chest performed 09/03/2021 with stable upper limit ascending aorta 39 mm and coronary calcification in the LAD and right coronary artery.  Compliance with diet, lifestyle and medications: No he has stopped his statin as well as amlodipine  He has had no angina edema shortness of breath chest pain palpitation or syncope By CT criteria his aorta is stable and top upper limit. 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   lansoprazole (PREVACID) 30 MG capsule Take 1 capsule (  30 mg total) by mouth daily. Needs appointment before anymore refills given   nitroGLYCERIN (NITROSTAT) 0.4 MG SL tablet Place 1 tablet (0.4 mg total) under the tongue every 5 (five) minutes as needed.   rosuvastatin (CRESTOR) 10 MG tablet Take 1 tablet (10 mg total) by mouth 2 (two) times a week.     Allergies:   Patient has no known allergies.   Social History   Socioeconomic History   Marital status: Married    Spouse name: Not on file   Number of children: Not on file   Years of education: Not on file   Highest education level: Not on file  Occupational History   Not on file  Tobacco Use   Smoking status:  Never   Smokeless tobacco: Never  Vaping Use   Vaping Use: Never used  Substance and Sexual Activity   Alcohol use: Never   Drug use: Never   Sexual activity: Not on file  Other Topics Concern   Not on file  Social History Narrative   Not on file   Social Determinants of Health   Financial Resource Strain: Low Risk  (05/04/2021)   Overall Financial Resource Strain (CARDIA)    Difficulty of Paying Living Expenses: Not hard at all  Food Insecurity: No Food Insecurity (05/04/2021)   Hunger Vital Sign    Worried About Running Out of Food in the Last Year: Never true    [place] in the Last Year: Never true  Transportation Needs: No Transportation Needs (05/04/2021)   PRAPARE - Hydrologist (Medical): No    Lack of Transportation (Non-Medical): No  Physical Activity: Sufficiently Active (05/04/2021)   Exercise Vital Sign    Days of Exercise per Week: 4 days    Minutes of Exercise per Session: 40 min  Stress: No Stress Concern Present (05/04/2021)   [place]    Feeling of Stress : Not at all  Social Connections: Moderately Integrated (05/04/2021)   Social Connection and Isolation Panel [NHANES]    Frequency of Communication with Friends and Family: Twice a week    Frequency of Social Gatherings with Friends and Family: Twice a week    Attends Religious Services: More than 4 times per year    Active Member of [employer] or Organizations: No    Attends Archivist Meetings: Never    Marital Status: Married     Family History: The patient's family history includes Heart Problems in his maternal uncle; Hypertension in his brother and mother. ROS:   Please see the history of present illness.    All other systems reviewed and are negative.  EKGs/Labs/Other Studies Reviewed:    The following studies were reviewed today:  EKG:  EKG ordered today and personally reviewed.  The ekg  ordered today demonstrates sinus rhythm 1 PVC otherwise normal EKG  Recent Labs: 09/23/2021: ALT 34; BUN 15; Creat 1.09; Hemoglobin 16.4; Magnesium 2.0; Platelets 255; Potassium 4.0; Sodium 139  Recent Lipid Panel    Component Value Date/Time   CHOL 176 09/23/2021 1622   CHOL 119 05/21/2019 1626   TRIG 133 09/23/2021 1622   HDL 47 09/23/2021 1622   HDL 48 05/21/2019 1626   CHOLHDL 3.7 09/23/2021 1622   VLDL 26.0 05/07/2017 1608   LDLCALC 106 (H) 09/23/2021 1622   LDLDIRECT 60 04/29/2020 1449    Physical Exam:    VS:  BP 124/64  Pulse 72   Ht 6' 2"  (1.88 m)   Wt 232 lb 6.4 oz (105.4 kg)   SpO2 95%   BMI 29.84 kg/m     Wt Readings from Last 3 Encounters:  09/29/21 232 lb 6.4 oz (105.4 kg)  09/23/21 230 lb 3.2 oz (104.4 kg)  09/16/20 229 lb 0.6 oz (103.9 kg)     GEN:  Well nourished, well developed in no acute distress HEENT: Normal NECK: No JVD; No carotid bruits LYMPHATICS: No lymphadenopathy CARDIAC: RRR, no murmurs, rubs, gallops RESPIRATORY:  Clear to auscultation without rales, wheezing or rhonchi  ABDOMEN: Soft, non-tender, non-distended MUSCULOSKELETAL:  No edema; No deformity  SKIN: Warm and dry NEUROLOGIC:  Alert and oriented x 3 PSYCHIATRIC:  Normal affect    Signed, Shirlee More, MD  09/29/2021 5:03 PM    [place]

## 2021-09-29 ENCOUNTER — Ambulatory Visit: Payer: Medicare Other | Attending: Cardiology | Admitting: Cardiology

## 2021-09-29 ENCOUNTER — Encounter: Payer: Self-pay | Admitting: Cardiology

## 2021-09-29 VITALS — BP 124/64 | HR 72 | Ht 74.0 in | Wt 232.4 lb

## 2021-09-29 DIAGNOSIS — E782 Mixed hyperlipidemia: Secondary | ICD-10-CM | POA: Diagnosis not present

## 2021-09-29 DIAGNOSIS — I10 Essential (primary) hypertension: Secondary | ICD-10-CM | POA: Diagnosis not present

## 2021-09-29 DIAGNOSIS — I25118 Atherosclerotic heart disease of native coronary artery with other forms of angina pectoris: Secondary | ICD-10-CM | POA: Diagnosis not present

## 2021-09-29 DIAGNOSIS — I7789 Other specified disorders of arteries and arterioles: Secondary | ICD-10-CM | POA: Diagnosis not present

## 2021-09-29 MED ORDER — ROSUVASTATIN CALCIUM 10 MG PO TABS: 10.0000 mg | ORAL_TABLET | ORAL | 3 refills | Status: DC

## 2021-09-29 NOTE — Patient Instructions (Signed)
Medication Instructions:  Your physician has recommended you make the following change in your medication:   START: Rosuvastatin 10 mg 2 times weekly  *If you need a refill on your cardiac medications before your next appointment, please call your pharmacy*   Lab Work: Your physician recommends that you return for lab work in:   Labs in 3 month: CMP, Lipids  If you have labs (blood work) drawn today and your tests are completely normal, you will receive your results only by: [place] (if you have [place]) OR A paper copy in the mail If you have any lab test that is abnormal or we need to change your treatment, we will call you to review the results.   Testing/Procedures: None   Follow-Up: At [facility], you and your health needs are our priority.  As part of our continuing mission to provide you with exceptional heart care, we have created designated Provider Care Teams.  These Care Teams include your primary Cardiologist (physician) and Advanced Practice Providers (APPs -  Physician Assistants and Nurse Practitioners) who all work together to provide you with the care you need, when you need it.  We recommend signing up for the patient portal called "MyChart".  Sign up information is provided on this After Visit Summary.  MyChart is used to connect with patients for Virtual Visits (Telemedicine).  Patients are able to view lab/test results, encounter notes, upcoming appointments, etc.  Non-urgent messages can be sent to your provider as well.   To learn more about what you can do with MyChart, go to [email or web address].    Your next appointment:   1 year(s)  The format for your next appointment:   In Person  Provider:   Shirlee More, MD    Other Instructions None  Important Information About Sugar

## 2021-10-03 DIAGNOSIS — M25561 Pain in right knee: Secondary | ICD-10-CM | POA: Diagnosis not present

## 2021-10-11 DIAGNOSIS — M1711 Unilateral primary osteoarthritis, right knee: Secondary | ICD-10-CM | POA: Diagnosis not present

## 2021-10-11 DIAGNOSIS — M25561 Pain in right knee: Secondary | ICD-10-CM | POA: Diagnosis not present

## 2021-10-11 DIAGNOSIS — S83241A Other tear of medial meniscus, current injury, right knee, initial encounter: Secondary | ICD-10-CM | POA: Diagnosis not present

## 2021-10-11 DIAGNOSIS — M23300 Other meniscus derangements, unspecified lateral meniscus, right knee: Secondary | ICD-10-CM | POA: Diagnosis not present

## 2021-10-20 ENCOUNTER — Telehealth: Payer: Self-pay | Admitting: Family Medicine

## 2021-10-20 NOTE — Telephone Encounter (Signed)
Please advise message below patient states that he was supposed to have Rx to help with swelling in his legs.

## 2021-10-20 NOTE — Telephone Encounter (Signed)
Pt is wanting to get a script for hydrochlorothiazide (HYDRODIURIL) 25 MG tablet [[id number]]. He said  his legs are starting to swell. I offered an appointment, he declined and wanted a phone message put in first.   [place] [place] [facility] [place]  [address] [place] [address]  Phone:  [phone]  Fax:  [phone]

## 2021-10-21 ENCOUNTER — Telehealth: Payer: Self-pay | Admitting: Cardiology

## 2021-10-21 NOTE — Telephone Encounter (Signed)
Pt c/o medication issue:  1. Name of Medication:   hydrochlorothiazide (HYDRODIURIL) 25 MG tablet  2. How are you currently taking this medication (dosage and times per day)?  Had stopped taking this medication  3. Are you having a reaction (difficulty breathing--STAT)?   No  4. What is your medication issue?   Patient stated he has regained weight and would like to re-start taking this medication.  Patient would like the prescription sent to [place], [place].  Patient stated he has none of this medication now.

## 2021-10-21 NOTE — Telephone Encounter (Addendum)
Spoke with pt who states he has been monitoring his BP since his appointment with Dr Bettina Gavia on 09/14 and reports BP is running 140's-170's/70's-90's.  Pt states he has gained weight being unable to exercise due to his knee and feels this is contributing to his elevated BP as well.  Pt denies CP, SOB or edema.  He would like to start back on BP medication. Pt advised will forward to Dr Bettina Gavia for review and recommendation.  Pt verbalizes understanding and agrees with current plan.

## 2021-10-21 NOTE — Telephone Encounter (Signed)
Called patient to inform that OV is recommended, no answer LMTCB and schedule appointment.

## 2021-10-24 NOTE — Telephone Encounter (Signed)
Left a message for the pt to call back.   Per Dr Bettina Gavia:   Yes should fu with PCP re hypertension

## 2021-10-25 ENCOUNTER — Telehealth: Payer: Self-pay | Admitting: Family Medicine

## 2021-10-25 NOTE — Telephone Encounter (Signed)
Left VM for pt to call back.

## 2021-10-25 NOTE — Telephone Encounter (Signed)
Caller Name: Ram[place] Call back phone #: [phone]   MEDICATION(S):  Amlodipine  Pt states causing headaches. He has been taking for a month. I advised appt needed. Pt declined and said he is hoping med can be changed w/o visit. Previously was taking '25mg'$  HCTZ and did not have side effects.  Preferred Pharmacy:  [facility], [place]

## 2021-10-25 NOTE — Telephone Encounter (Signed)
Please advise message below  °

## 2021-10-26 NOTE — Telephone Encounter (Signed)
Recommendations reviewed with pt as per Dr. Munley's note.  Pt verbalized understanding and had no additional questions. Routed to PCP  

## 2021-11-01 DIAGNOSIS — L820 Inflamed seborrheic keratosis: Secondary | ICD-10-CM | POA: Diagnosis not present

## 2021-11-01 DIAGNOSIS — L570 Actinic keratosis: Secondary | ICD-10-CM | POA: Diagnosis not present

## 2021-11-01 DIAGNOSIS — L821 Other seborrheic keratosis: Secondary | ICD-10-CM | POA: Diagnosis not present

## 2021-11-01 DIAGNOSIS — L578 Other skin changes due to chronic exposure to nonionizing radiation: Secondary | ICD-10-CM | POA: Diagnosis not present

## 2021-11-01 NOTE — Telephone Encounter (Signed)
Patient aware of message below.

## 2021-11-08 ENCOUNTER — Other Ambulatory Visit: Payer: Self-pay | Admitting: Family Medicine

## 2021-11-08 DIAGNOSIS — K219 Gastro-esophageal reflux disease without esophagitis: Secondary | ICD-10-CM

## 2021-12-26 ENCOUNTER — Telehealth: Payer: Self-pay | Admitting: Cardiology

## 2021-12-26 ENCOUNTER — Other Ambulatory Visit: Payer: Self-pay

## 2021-12-26 ENCOUNTER — Telehealth: Payer: Self-pay | Admitting: Family Medicine

## 2021-12-26 ENCOUNTER — Other Ambulatory Visit: Payer: Self-pay | Admitting: Cardiology

## 2021-12-26 MED ORDER — ROSUVASTATIN CALCIUM 10 MG PO TABS: 10.0000 mg | ORAL_TABLET | ORAL | 3 refills | Status: DC

## 2021-12-26 NOTE — Telephone Encounter (Signed)
Patient informed that his rosuvastatin was re-filled.

## 2021-12-26 NOTE — Telephone Encounter (Signed)
*  STAT* If patient is at the pharmacy, call can be transferred to refill team.   1. Which medications need to be refilled? (please list name of each medication and dose if known) rosuvastatin (CRESTOR) 10 MG tablet   2. Which pharmacy/location (including street and city if local pharmacy) is medication to be sent to?  [place], [place]    3. Do they need a 30 day or 90 day supply? [place]

## 2021-12-26 NOTE — Telephone Encounter (Signed)
Caller Name: Princeton Nabor Call back phone #: [phone]  Reason for Call: Pt's Amlodipine has been denied, please call to inform tham as to why. If able to fill please send to  [place], [place]  Phone: [phone]  Fax: [phone]

## 2021-12-27 NOTE — Telephone Encounter (Signed)
Pt is concerned about going without medication. Please send to pharmacy listed below

## 2021-12-28 ENCOUNTER — Telehealth: Payer: Self-pay | Admitting: Family Medicine

## 2021-12-28 NOTE — Telephone Encounter (Signed)
Refill request for Amlodipine last OV 09/23/21 Rx was discontinued in 10/18/20 called patient to see if he had still been taking after discontinued. Please advise

## 2021-12-28 NOTE — Telephone Encounter (Signed)
Pt missed a call and he was wondering if it had anything to do with his Amlodipine. Please advise pt at [phone]

## 2021-12-28 NOTE — Telephone Encounter (Signed)
Spoke with patient inform that Rx was discontinued back October 2022 by someone at a different location. Appointment scheduled for follow up appointment

## 2021-12-29 NOTE — Telephone Encounter (Signed)
Appointment scheduled for follow up

## 2021-12-30 ENCOUNTER — Encounter: Payer: Self-pay | Admitting: Family Medicine

## 2021-12-30 ENCOUNTER — Ambulatory Visit (INDEPENDENT_AMBULATORY_CARE_PROVIDER_SITE_OTHER): Payer: Medicare Other | Admitting: Family Medicine

## 2021-12-30 VITALS — BP 126/70 | HR 56 | Temp 97.4°F | Ht 74.0 in | Wt 239.4 lb

## 2021-12-30 DIAGNOSIS — R030 Elevated blood-pressure reading, without diagnosis of hypertension: Secondary | ICD-10-CM | POA: Insufficient documentation

## 2021-12-30 NOTE — Progress Notes (Signed)
Established Patient Office Visit   Subjective:  Patient ID: Jeffrey Poole, male    DOB: Mar 31, 1950  Age: 71 y.o. MRN: [id number]  Chief Complaint  Patient presents with   Follow-up    Routine follow up, refill on BP medication. Elevated readings at home.     HPI  Encounter Diagnoses  Name Primary?   Elevated BP without diagnosis of hypertension Yes   ere today out of concern for high blood pressure reading of 170/90.  He remembered that he had consumed 3 doses of [facility] just prior to this reading.  He checks his blood pressure regularly at home and typically it runs in the 120s over 70s.  He has had no shortness of breath or chest pain.   Review of Systems  Constitutional: Negative.   HENT: Negative.    Eyes:  Negative for blurred vision, discharge and redness.  Respiratory: Negative.    Cardiovascular: Negative.   Gastrointestinal:  Negative for abdominal pain.  Genitourinary: Negative.   Musculoskeletal: Negative.  Negative for myalgias.  Skin:  Negative for rash.  Neurological:  Negative for tingling, loss of consciousness, weakness and headaches.  Endo/Heme/Allergies:  Negative for polydipsia.     Current Outpatient Medications:    aspirin EC 81 MG tablet, Take 1 tablet (81 mg total) by mouth daily., Disp: 90 tablet, Rfl: 3   lansoprazole (PREVACID) 30 MG capsule, TAKE 1 CAPSULE BY MOUTH ONCE DAILY **NEEDS  APPOINTMENT  BEFORE  ANYMORE  REFILLS  GIVEN, Disp: 90 capsule, Rfl: 0   nitroGLYCERIN (NITROSTAT) 0.4 MG SL tablet, Place 1 tablet (0.4 mg total) under the tongue every 5 (five) minutes as needed., Disp: 25 tablet, Rfl: 3   rosuvastatin (CRESTOR) 10 MG tablet, Take 1 tablet (10 mg total) by mouth 2 (two) times a week., Disp: 30 tablet, Rfl: 3   Objective:     BP 126/70 (BP Location: Left Arm, Patient Position: Sitting, Cuff Size: Large)   Pulse (!) 56   Temp (!) 97.4 F (36.3 C) (Temporal)   Ht '6\' 2"'$  (1.88 m)   Wt 239 lb 6.4 oz (108.6 kg)   SpO2 97%    BMI 30.74 kg/m  BP Readings from Last 3 Encounters:  12/30/21 126/70  09/29/21 124/64  09/23/21 128/78   Wt Readings from Last 3 Encounters:  12/30/21 239 lb 6.4 oz (108.6 kg)  09/29/21 232 lb 6.4 oz (105.4 kg)  09/23/21 230 lb 3.2 oz (104.4 kg)      Physical Exam Constitutional:      General: He is not in acute distress.    Appearance: Normal appearance. He is not ill-appearing, toxic-appearing or diaphoretic.  HENT:     Head: Normocephalic and atraumatic.     Right Ear: External ear normal.     Left Ear: External ear normal.  Eyes:     General: No scleral icterus.       Right eye: No discharge.        Left eye: No discharge.     Extraocular Movements: Extraocular movements intact.     Conjunctiva/sclera: Conjunctivae normal.  Pulmonary:     Effort: Pulmonary effort is normal. No respiratory distress.  Skin:    General: Skin is warm and dry.  Neurological:     Mental Status: He is alert and oriented to person, place, and time.  Psychiatric:        Mood and Affect: Mood normal.        Behavior: Behavior normal.  No results found for any visits on 12/30/21.    The 10-year ASCVD risk score (Arnett DK, et al., 2019) is: 21%    Assessment & Plan:   Elevated BP without diagnosis of hypertension    Return if symptoms worsen or fail to improve.  Information was given on preventing hypertension.  Of course he will avoid salt loads.  Libby Maw, MD

## 2022-01-02 DIAGNOSIS — E782 Mixed hyperlipidemia: Secondary | ICD-10-CM | POA: Diagnosis not present

## 2022-01-03 LAB — COMPREHENSIVE METABOLIC PANEL
ALT: 36 IU/L (ref 0–44)
AST: 27 IU/L (ref 0–40)
Albumin/Globulin Ratio: 1.6 (ref 1.2–2.2)
Albumin: 4.5 g/dL (ref 3.8–4.8)
Alkaline Phosphatase: 91 IU/L (ref 44–121)
BUN/Creatinine Ratio: 9 — ABNORMAL LOW (ref 10–24)
BUN: 9 mg/dL (ref 8–27)
Bilirubin Total: 0.6 mg/dL (ref 0.0–1.2)
CO2: 24 mmol/L (ref 20–29)
Calcium: 9.6 mg/dL (ref 8.6–10.2)
Chloride: 104 mmol/L (ref 96–106)
Creatinine, Ser: 1 mg/dL (ref 0.76–1.27)
Globulin, Total: 2.9 g/dL (ref 1.5–4.5)
Glucose: 102 mg/dL — ABNORMAL HIGH (ref 70–99)
Potassium: 4.5 mmol/L (ref 3.5–5.2)
Sodium: 139 mmol/L (ref 134–144)
Total Protein: 7.4 g/dL (ref 6.0–8.5)
eGFR: 80 mL/min/{1.73_m2} (ref >59)

## 2022-01-03 LAB — LIPID PANEL
Chol/HDL Ratio: 3.5 ratio (ref 0.0–5.0)
Cholesterol, Total: 165 mg/dL (ref 100–199)
HDL: 47 mg/dL (ref >39)
LDL Chol Calc (NIH): 99 mg/dL (ref 0–99)
Triglycerides: 102 mg/dL (ref 0–149)
VLDL Cholesterol Cal: 19 mg/dL (ref 5–40)

## 2022-01-05 ENCOUNTER — Telehealth: Payer: Self-pay

## 2022-01-05 DIAGNOSIS — E782 Mixed hyperlipidemia: Secondary | ICD-10-CM

## 2022-01-05 MED ORDER — EZETIMIBE 10 MG PO TABS: 10.0000 mg | ORAL_TABLET | Freq: Every day | ORAL | 3 refills | Status: DC

## 2022-01-05 NOTE — Telephone Encounter (Signed)
-----   Message from Richardo Priest, MD sent at 01/03/2022 10:57 AM EST ----- His LDL cholesterol remains above goal  I think he would benefit from taking Zetia 10 mg along with his statin lets wait 2 months and recheck a lipid profile and I think he would be optimal LDL not less than 50-55.  This should not give him muscle symptoms.

## 2022-01-06 ENCOUNTER — Telehealth: Payer: Self-pay

## 2022-01-06 ENCOUNTER — Telehealth: Payer: Self-pay | Admitting: Cardiology

## 2022-01-06 MED ORDER — EZETIMIBE 10 MG PO TABS: 10.0000 mg | ORAL_TABLET | Freq: Every day | ORAL | 3 refills | Status: DC

## 2022-01-06 NOTE — Telephone Encounter (Signed)
Spoke with pt about Lipid panel. Agreed to try Zetia '10mg'$  and come back in 2 months for Lipids- per Dr. Joya Gaskins note

## 2022-01-06 NOTE — Telephone Encounter (Signed)
Pt would like a callback from nurses regarding results. Please advise

## 2022-01-17 ENCOUNTER — Telehealth: Payer: Self-pay | Admitting: Family Medicine

## 2022-01-17 NOTE — Telephone Encounter (Signed)
Pt want you to give him a call about hyypertention

## 2022-01-18 NOTE — Telephone Encounter (Signed)
Spoke with patient who seems to have concerns about elevated BP readings at home. Patient not sure if he needs medication for this or not. Scheduled appointment for follow up.

## 2022-01-19 ENCOUNTER — Ambulatory Visit: Payer: Medicare Other | Admitting: Family Medicine

## 2022-03-15 LAB — LIPID PANEL
Chol/HDL Ratio: 3.2 ratio (ref 0.0–5.0)
Cholesterol, Total: 141 mg/dL (ref 100–199)
HDL: 44 mg/dL (ref >39)
LDL Chol Calc (NIH): 70 mg/dL (ref 0–99)
Triglycerides: 155 mg/dL — ABNORMAL HIGH (ref 0–149)
VLDL Cholesterol Cal: 27 mg/dL (ref 5–40)

## 2022-03-20 IMAGING — CT CT ANGIO CHEST-ABD-PELV FOR DISSECTION W/ AND WO/W CM
2 of 9 series · 13 of 46 positions shown, 15 images · IV contrast (Omnipaque)
Comparison: Included portion from coronary CT 08/07/2018.

CLINICAL DATA: Aortic disease, nontraumatic syncopy , r/o
dissection

EXAM:
CT ANGIOGRAPHY CHEST, ABDOMEN AND PELVIS
TECHNIQUE: Non-contrast CT of the chest was initially obtained.

[Series 4: axial arterial · axial · arterial · 0.89mm/px · z∈[-650,-47]mm · 10 of 227 slices shown, 12 images]
[im 13/227  soft-tissue]
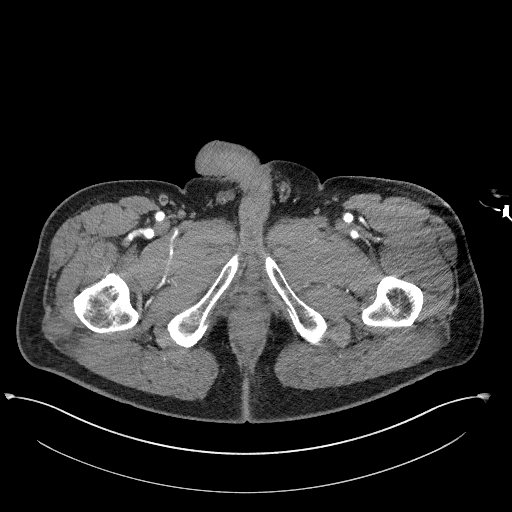
[im 13/227  bone]
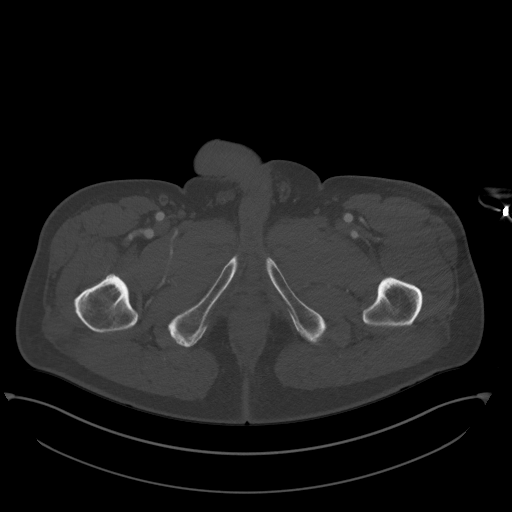
[im 38/227  soft-tissue]
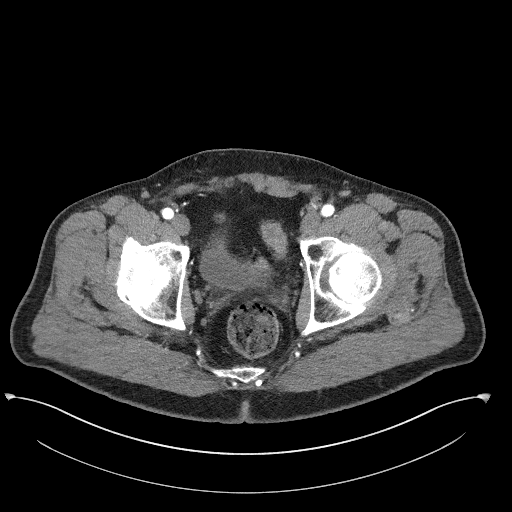
[im 63/227  soft-tissue]
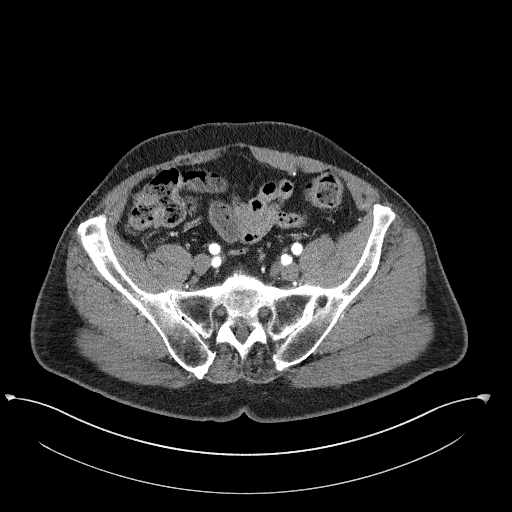
[im 76/227  soft-tissue]
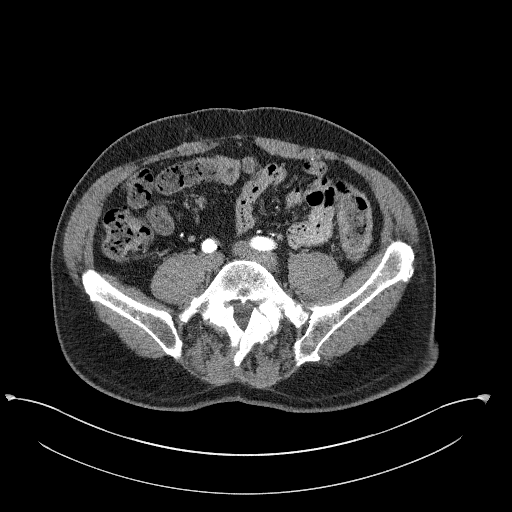
[im 101/227  soft-tissue]
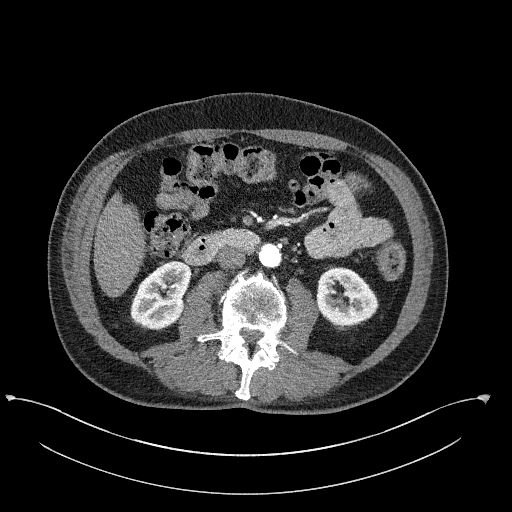
[im 126/227  soft-tissue]
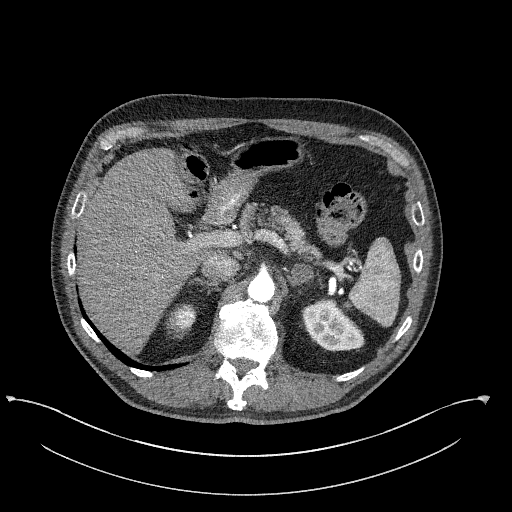
[im 151/227  soft-tissue]
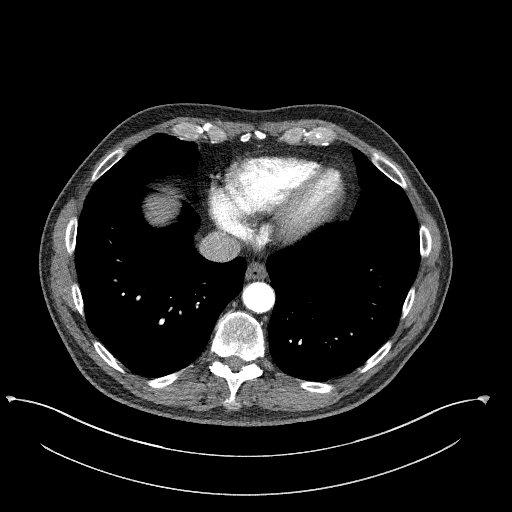
[im 164/227  soft-tissue]
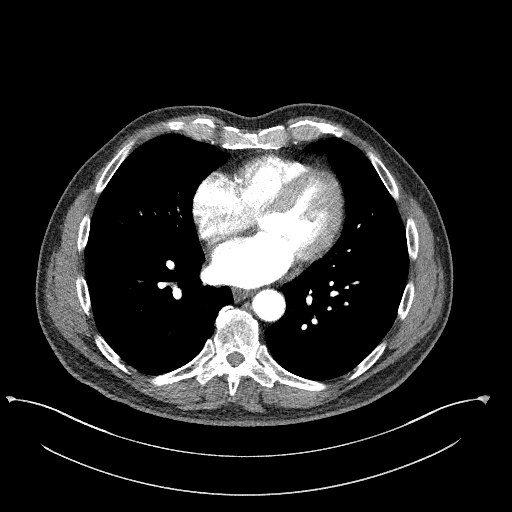
[im 189/227  soft-tissue]
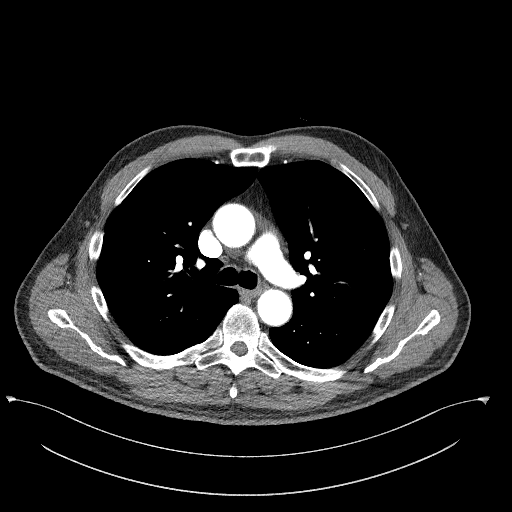
[im 189/227  bone]
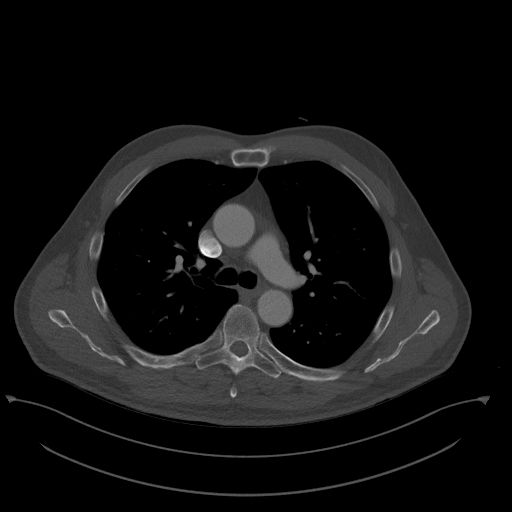
[im 214/227  soft-tissue]
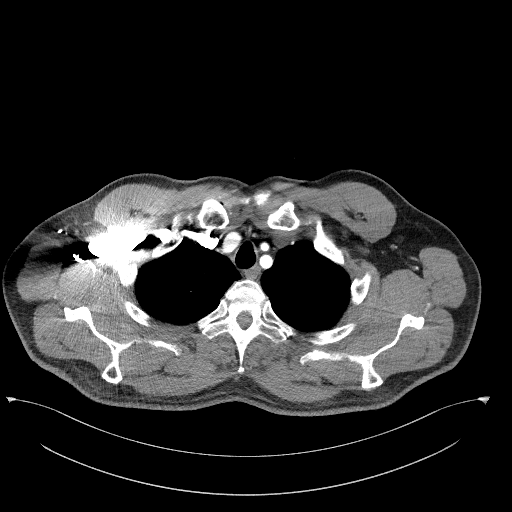

[Series 7: coronals · coronal · 0.79mm/px · 3 of 157 slices shown]
[im 40/157  soft-tissue]
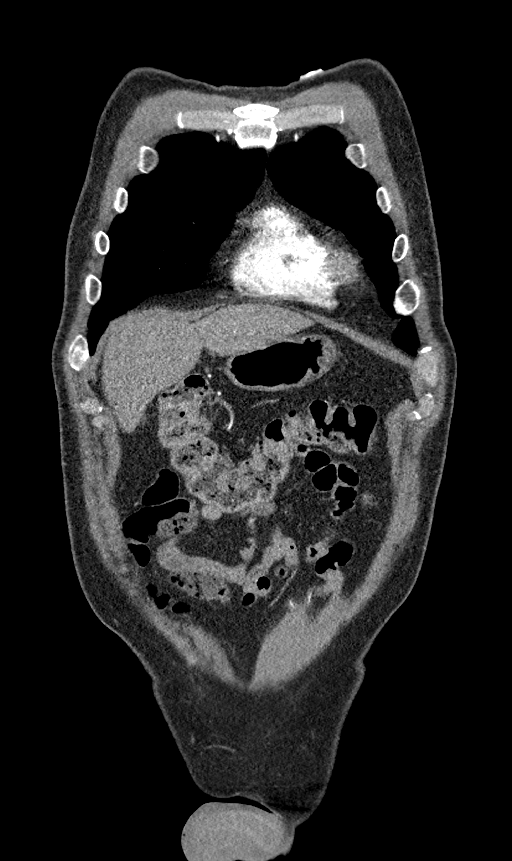
[im 79/157  soft-tissue]
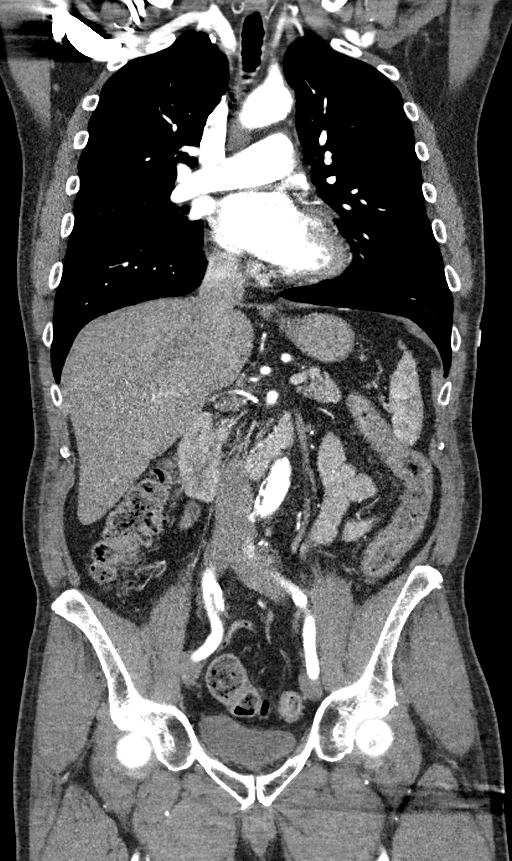
[im 118/157  soft-tissue]
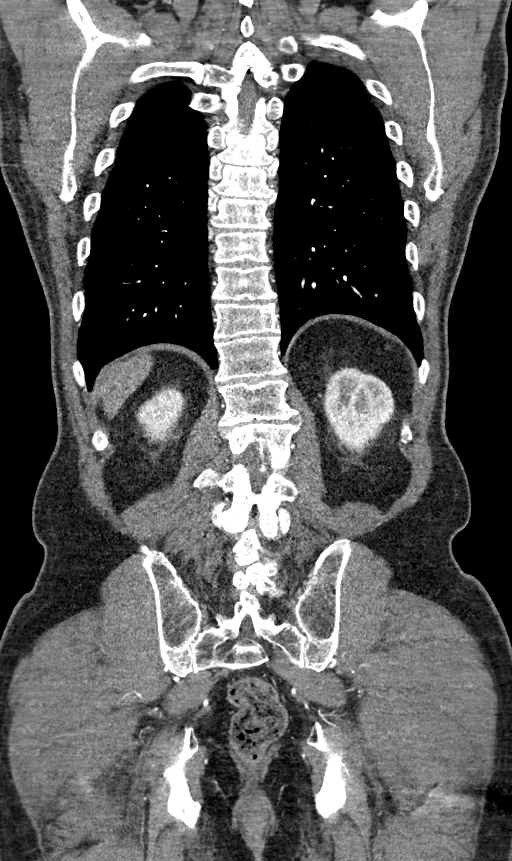

[13 of 46 positions shown; findings below may reference images not displayed]

Multidetector CT imaging through the chest, abdomen and pelvis was
performed using the standard protocol during bolus administration of
intravenous contrast. Multiplanar reconstructed images and MIPs were
obtained and reviewed to evaluate the vascular anatomy.

CONTRAST:  100mL OMNIPAQUE IOHEXOL 350 MG/ML SOLN
FINDINGS: CTA CHEST FINDINGS

Cardiovascular: Noncontrast exam demonstrates no aortic hematoma. No
aortic aneurysm. There is no aortic dissection or evidence of acute
aortic syndrome. Mild cardiac motion partially obscures the
ascending aorta. Minor aortic atherosclerosis. Conventional
branching pattern from the aortic arch. There are coronary artery
calcifications. No filling defects in the central pulmonary
arteries. No pericardial effusion. Heart is normal in size.

Mediastinum/Nodes: No mediastinal or hilar adenopathy. No visualized
thyroid nodule. No esophageal wall thickening.

Lungs/Pleura: The lungs are clear. There is no focal airspace
disease. No pleural effusion or findings of pulmonary edema. Trachea
and central bronchi are patent. No pulmonary mass or suspicious
nodule.

Musculoskeletal: Mild diffuse degenerative change throughout the
spine. There are no acute or suspicious osseous abnormalities.

Review of the MIP images confirms the above findings.

CTA ABDOMEN AND PELVIS FINDINGS

VASCULAR

Aorta: Atherosclerosis without aneurysm. No dissection or acute
aortic findings. No significant stenosis.

Celiac: Patent without evidence of aneurysm, dissection, vasculitis
or significant stenosis.

SMA: Patent without evidence of aneurysm, dissection, vasculitis or
significant stenosis. Replaced right hepatic artery arises from the
SMA.

Renals: 2 codominant right renal arteries. Single left renal artery
patent. All renal arteries are patent without dissection or acute
findings. No significant stenosis.

IMA: Patent without evidence of aneurysm, dissection, vasculitis or
significant stenosis.

Inflow: Tortuous with atherosclerosis. No aneurysm, dissection, or
acute findings. No significant stenosis.

Veins: No obvious venous abnormality within the limitations of this
arterial phase study.

Review of the MIP images confirms the above findings.

NON-VASCULAR

Hepatobiliary: Small hypodensity in the left lobe of the liver is
incompletely characterized on this exam but likely small cyst.
Gallbladder is unremarkable. No calcified gallstone.

Pancreas: No ductal dilatation or inflammation.

Spleen: Normal in size without focal abnormality.

Adrenals/Urinary Tract: 19 mm low-density left adrenal nodule. This
has Hounsfield units of 4 on noncontrast exam and is consistent with
adenoma. Right adrenal gland is normal. No hydronephrosis or
perinephric edema. There is homogeneous renal enhancement. Urinary
bladder is partially distended. No bladder wall thickening.

Stomach/Bowel: The stomach is unremarkable. There is no small bowel
obstruction or inflammatory change. Normal appendix courses into the
central pelvis. Moderate volume of stool throughout the colon. There
is no colonic wall thickening. Mild distal diverticulosis without
diverticulitis. Small volume of stool in the rectum.

Lymphatic: No enlarged lymph nodes in the abdomen or pelvis.

Reproductive: Prostate is unremarkable.

Other: No ascites or free air. Tiny fat containing umbilical hernia.
Minimal fat in the left inguinal canal.

Musculoskeletal: Multilevel degenerative change in the lumbar spine.
Diffuse degenerative disc disease and facet arthropathy. There are
no acute or suspicious osseous abnormalities.

Review of the MIP images confirms the above findings.
IMPRESSION: 1. No aortic dissection or acute aortic abnormality.
2. No acute abnormality in the chest, abdomen, or pelvis.
3. Left adrenal adenoma.
4. Mild distal colonic diverticulosis without diverticulitis.
Moderate diffuse colonic stool burden can be seen with constipation.

Aortic Atherosclerosis ([id number]).

## 2022-03-28 ENCOUNTER — Other Ambulatory Visit: Payer: Self-pay | Admitting: Family Medicine

## 2022-03-28 DIAGNOSIS — K219 Gastro-esophageal reflux disease without esophagitis: Secondary | ICD-10-CM

## 2022-05-03 ENCOUNTER — Other Ambulatory Visit: Payer: Self-pay

## 2022-05-03 ENCOUNTER — Telehealth: Payer: Self-pay | Admitting: Cardiology

## 2022-05-03 ENCOUNTER — Encounter (HOSPITAL_BASED_OUTPATIENT_CLINIC_OR_DEPARTMENT_OTHER): Payer: Self-pay | Admitting: Pediatrics

## 2022-05-03 ENCOUNTER — Emergency Department (HOSPITAL_BASED_OUTPATIENT_CLINIC_OR_DEPARTMENT_OTHER)
Admission: EM | Admit: 2022-05-03 | Discharge: 2022-05-03 | Disposition: A | Discharge status: Home / Self Care | Payer: Medicare Other | Attending: Emergency Medicine | Admitting: Emergency Medicine

## 2022-05-03 ENCOUNTER — Emergency Department (HOSPITAL_BASED_OUTPATIENT_CLINIC_OR_DEPARTMENT_OTHER): Payer: Medicare Other

## 2022-05-03 DIAGNOSIS — R0789 Other chest pain: Secondary | ICD-10-CM | POA: Diagnosis not present

## 2022-05-03 DIAGNOSIS — I10 Essential (primary) hypertension: Secondary | ICD-10-CM | POA: Diagnosis not present

## 2022-05-03 DIAGNOSIS — Z7982 Long term (current) use of aspirin: Secondary | ICD-10-CM | POA: Diagnosis not present

## 2022-05-03 DIAGNOSIS — R110 Nausea: Secondary | ICD-10-CM | POA: Diagnosis not present

## 2022-05-03 DIAGNOSIS — I2510 Atherosclerotic heart disease of native coronary artery without angina pectoris: Secondary | ICD-10-CM | POA: Diagnosis not present

## 2022-05-03 DIAGNOSIS — R079 Chest pain, unspecified: Secondary | ICD-10-CM | POA: Diagnosis present

## 2022-05-03 LAB — COMPREHENSIVE METABOLIC PANEL
ALT: 29 U/L (ref 0–44)
AST: 27 U/L (ref 15–41)
Albumin: 4.1 g/dL (ref 3.5–5.0)
Alkaline Phosphatase: 79 U/L (ref 38–126)
Anion gap: 10 (ref 5–15)
BUN: 13 mg/dL (ref 8–23)
CO2: 21 mmol/L — ABNORMAL LOW (ref 22–32)
Calcium: 8.7 mg/dL — ABNORMAL LOW (ref 8.9–10.3)
Chloride: 105 mmol/L (ref 98–111)
Creatinine, Ser: 0.93 mg/dL (ref 0.61–1.24)
GFR, Estimated: >60 mL/min (ref >60)
Glucose, Bld: 118 mg/dL — ABNORMAL HIGH (ref 70–99)
Potassium: 3.8 mmol/L (ref 3.5–5.1)
Sodium: 136 mmol/L (ref 135–145)
Total Bilirubin: 0.6 mg/dL (ref 0.3–1.2)
Total Protein: 7.4 g/dL (ref 6.5–8.1)

## 2022-05-03 LAB — LIPASE, BLOOD: Lipase: 29 U/L (ref 11–51)

## 2022-05-03 LAB — TROPONIN I (HIGH SENSITIVITY)
Troponin I (High Sensitivity): 10 ng/L (ref <18)
Troponin I (High Sensitivity): 11 ng/L (ref <18)

## 2022-05-03 LAB — CBC
HCT: 48.6 % (ref 39.0–52.0)
Hemoglobin: 16.3 g/dL (ref 13.0–17.0)
MCH: 27 pg (ref 26.0–34.0)
MCHC: 33.5 g/dL (ref 30.0–36.0)
MCV: 80.5 fL (ref 80.0–100.0)
Platelets: 227 10*3/uL (ref 150–400)
RBC: 6.04 MIL/uL — ABNORMAL HIGH (ref 4.22–5.81)
RDW: 15 % (ref 11.5–15.5)
WBC: 7.2 10*3/uL (ref 4.0–10.5)
nRBC: 0 % (ref 0.0–0.2)

## 2022-05-03 MED ORDER — KETOROLAC TROMETHAMINE 15 MG/ML IJ SOLN
15.0000 mg | Freq: Once | INTRAMUSCULAR | Status: AC
  Administered 2022-05-03: 15 mg via INTRAVENOUS
  Filled 2022-05-03: qty 1

## 2022-05-03 MED ORDER — ACETAMINOPHEN 500 MG PO TABS: 1000.0000 mg | ORAL_TABLET | Freq: Four times a day (QID) | ORAL | Status: DC | PRN

## 2022-05-03 NOTE — ED Notes (Signed)
Discharge paperwork reviewed entirely with patient, including Rx's and follow up care. Pain was under control. Pt verbalized understanding as well as all parties involved. No questions or concerns voiced at the time of discharge. No acute distress noted.   Pt ambulated out to PVA without incident or assistance.  

## 2022-05-03 NOTE — ED Triage Notes (Signed)
C/O chest pain started on Monday while at rest, stated a couple of episode that him up a couple of times. Endorsed pain is worst on palpation. States seen by cardiologist and has hx of partial blockage. Family reports increased activity last week and was working with a lot of lumber.

## 2022-05-03 NOTE — Telephone Encounter (Signed)
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  Pt declined heart cath at that time. Strongly encouraged to go directly to the ED as his sx are concerning he is having a MI. Pt verbalized understanding and states he will go to the ED. Pt had no additional questions.

## 2022-05-03 NOTE — Telephone Encounter (Signed)
Pt c/o of Chest Pain: STAT if CP now or developed within 24 hours  1. Are you having CP right now? No - pt states it's a "dull ache"  2. Are you experiencing any other symptoms (ex. SOB, nausea, vomiting, sweating)? SOB, nausea  3. How long have you been experiencing CP? 2 days  4. Is your CP continuous or coming and going? Continuous   5. Have you taken Nitroglycerin? No ?

## 2022-05-03 NOTE — ED Provider Notes (Signed)
[facility] Provider Note   CSN: [id number] Arrival date & time: 05/03/22  1525     History  Chief Complaint  Patient presents with   Chest Pain    Jeffrey Poole is a 72 y.o. male with PMHx s/f HTN, GERD, and CAD who presents to ED complaining of chest pain x3 days. Also complains of mild nausea. Pain was described as a mild dull ache that progressed to pressure. Patient states that taking deep breaths and palpating chest makes pain worse. Denies fever, chills, lightheadedness, cough, dyspnea,  vomiting, diarrhea, leg swelling. Denies recent surgeries, travel, history of blood clots, and calf tenderness/swelling.   Chest Pain      Home Medications Prior to Admission medications   Medication Sig Start Date End Date Taking? Authorizing Provider  aspirin EC 81 MG tablet Take 1 tablet (81 mg total) by mouth daily. 08/09/18   Baldo Daub, MD  ezetimibe (ZETIA) 10 MG tablet Take 1 tablet (10 mg total) by mouth daily. 01/06/22 01/01/23  Baldo Daub, MD  lansoprazole (PREVACID) 30 MG capsule TAKE 1 CAPSULE BY MOUTH ONCE DAILY . APPOINTMENT REQUIRED FOR FUTURE REFILLS 03/29/22   Mliss Sax, MD  nitroGLYCERIN (NITROSTAT) 0.4 MG SL tablet Place 1 tablet (0.4 mg total) under the tongue every 5 (five) minutes as needed. 08/14/18 11/26/24  Baldo Daub, MD  rosuvastatin (CRESTOR) 10 MG tablet Take 1 tablet (10 mg total) by mouth 2 (two) times a week. 12/26/21   Baldo Daub, MD      Allergies    Patient has no known allergies.    Review of Systems   Review of Systems  Cardiovascular:  Positive for chest pain.    Physical Exam Updated Vital Signs BP (!) 154/95 (BP Location: Left Arm)   Pulse 61   Temp 98.3 F (36.8 C) (Oral)   Resp 15   Ht 6\' 1"  (1.854 m)   Wt 104.3 kg   SpO2 97%   BMI 30.34 kg/m  Physical Exam Vitals and nursing note reviewed.  Constitutional:      General: He is not in acute distress. HENT:      Head: Normocephalic and atraumatic.     Mouth/Throat:     Mouth: Mucous membranes are moist.     Pharynx: No oropharyngeal exudate or posterior oropharyngeal erythema.  Eyes:     General: No scleral icterus.       Right eye: No discharge.        Left eye: No discharge.     Conjunctiva/sclera: Conjunctivae normal.  Cardiovascular:     Rate and Rhythm: Normal rate and regular rhythm.     Pulses: Normal pulses.          Radial pulses are 2+ on the right side and 2+ on the left side.       Dorsalis pedis pulses are 2+ on the right side and 2+ on the left side.     Heart sounds: Normal heart sounds. No murmur heard. Pulmonary:     Effort: Pulmonary effort is normal. No respiratory distress.     Breath sounds: Normal breath sounds. No wheezing, rhonchi or rales.  Chest:     Chest wall: Tenderness present. No mass or deformity.     Comments: Tenderness of lower central/left chest Abdominal:     General: Bowel sounds are normal.     Palpations: Abdomen is soft.     Tenderness: There is  no abdominal tenderness.  Musculoskeletal:     Right lower leg: No tenderness. No edema.     Left lower leg: No tenderness. No edema.  Skin:    General: Skin is warm and dry.     Findings: No rash.  Neurological:     General: No focal deficit present.     Mental Status: He is alert. Mental status is at baseline.  Psychiatric:        Mood and Affect: Mood normal.     ED Results / Procedures / Treatments   Labs (all labs ordered are listed, but only abnormal results are displayed) Labs Reviewed  CBC - Abnormal; Notable for the following components:      Result Value   RBC 6.04 (*)    All other components within normal limits  COMPREHENSIVE METABOLIC PANEL - Abnormal; Notable for the following components:   CO2 21 (*)    Glucose, Bld 118 (*)    Calcium 8.7 (*)    All other components within normal limits  LIPASE, BLOOD  TROPONIN I (HIGH SENSITIVITY)  TROPONIN I (HIGH SENSITIVITY)     EKG None  Radiology DG Chest Portable 1 View  Result Date: 05/03/2022 CLINICAL DATA:  Chest pain EXAM: PORTABLE CHEST 1 VIEW COMPARISON:  CT 09/03/2021 and older FINDINGS: Hyperinflation. No consolidation, pneumothorax or effusion. Normal cardiopericardial silhouette. Tortuous aorta. Films are under penetrated. Apical pleural thickening. Tiny nodule overlying the anterior aspect of the left fifth rib IMPRESSION: Hyperinflation.  No consolidation. Tiny nodule overlying the anterior aspect of the left fifth rib. This is not clearly seen on the prior CT scan. Please correlate for any additional more recent prior or follow up imaging when clinically appropriate such as CT Electronically Signed   By: Karen Kays M.D.   On: 05/03/2022 15:53    Procedures Procedures    Medications Ordered in ED Medications  ketorolac (TORADOL) 15 MG/ML injection 15 mg (has no administration in time range)  acetaminophen (TYLENOL) tablet 1,000 mg (has no administration in time range)    ED Course/ Medical Decision Making/ A&P Clinical Course as of 05/03/22 1903  Wed May 03, 2022  1713 Stable 52 YOM with a chief complaint of CP x3 days.  Has pressure/dull ache in his chest. HEART 4 [CC]  1858 Evaluated personally at bedside.  Symptoms grossly improved still some residual chest pain.  Given his positive coronary studies in the past they have recommended admission for cardiology evaluation and likely catheterization and patient declined at this time.  States he understands the risk of missed disease including possible death but states that if "Jesus comes for my heart, that is his decision". Given his understanding of risk, will proceed with outpatient directed discharge with plan for patient to follow-up with his cardiologist in a.m. as scheduled.  Patient is welcome to return for completion of his management at any time. [CC]    Clinical Course User Index [CC] Glyn Ade, MD                              Medical Decision Making Amount and/or Complexity of Data Reviewed Labs: ordered. Radiology: ordered.   This patient presents to the ED for concern of chest pain, this involves an extensive number of treatment options, and is a complaint that carries with it a high risk of complications and morbidity.  The differential diagnosis includes acute coronary syndrome, congestive heart failure, pericarditis, pneumonia,  pulmonary embolism, tension pneumothorax, esophageal rupture, aortic dissection, cardiac tamponade, musculoskeletal   Co morbidities that complicate the patient evaluation  none   Additional history obtained:  none   Lab Tests:  I Ordered, and personally interpreted labs.  The pertinent results include:  - Troponin: initial and repeat within normal limits - BMP: no concern for electrolyte abnormality - CBC: no concern for anemia or leukocytosis   Imaging Studies ordered:  I ordered imaging studies including chest xray -Hyperinflation.  No consolidation. -Tiny nodule overlying the anterior aspect of the left fifth rib. -This is not clearly seen on the prior CT scan. Please correlate for any additional more recent prior or follow up imaging when clinically appropriate such as CT I independently visualized and interpreted imaging I agree with the radiologist interpretation   Cardiac Monitoring: / EKG:  The patient was maintained on a cardiac monitor.  I personally viewed and interpreted the cardiac monitored which showed an underlying rhythm of: Normal sinus rhythm without acute ST changes   Risk Stratification Score:  - HEART Score: 4 (high risk) - Wells Score: 0   Problem List / ED Course / Critical interventions / Medication management  3:50PM Patient presented for chest pain. EKG without concern for ACS. Initial troponin within normal limits. Pain is reproducible with palpation. I am not overly concerned that patient is having AMI at this time, but  given patient's multiple risk factors and high HEART score, I recommend patient being admitted inpatient for observation and treatment.  7:03 PM Repeat troponin within normal limits. Given patients physical exam and lab results, I am most concerned for costochondritis. However, as mentioned earlier, I still believe that patient could benefit from inpatient admission and observation. I shared this information with patient who declined admission. Patient understands that his/her actions will lead to inadequate medical workup, and that he/she is at risk of complications of missed diagnosis, which includes morbidity and mortality. Patient stated that he would rather follow up with cardiologist. Patient is demonstrating good capacity to make decision. Patient understands that he/she needs to return to the ER immediately if his/her symptoms get worse.   Ddx:  These are considered less likely due to history of present illness and physical exam findings.  Acute coronary syndrome: EKG and troponins within normal limits  Congestive heart failure: patient denies orthopnea, cough, and leg edema Pericarditis: pain is not positional and patient denies orthopnea and recent illness Pneumonia: lungs are clear to auscultation bilaterally Pulmonary embolism: no recent surgeries, blood clot hx, hemoptysis, cancer hx, vitals stable Pneumothorax: lungs are clear to auscultation bilaterally Esophageal rupture: patient denies vomiting, heavy drinking, and hx of GERD Aortic dissection: vital signs are stable, no variation in pulse pressure Cardiac tamponade: absence of hypotension, JVD, and muffled heart sounds Musculoskeletal:   Social Determinants of Health:  none          Final Clinical Impression(s) / ED Diagnoses Final diagnoses:  Other chest pain    Rx / DC Orders ED Discharge Orders     None         Dorthy Cooler, [place] 05/03/22 1903    Glyn Ade, MD 05/05/22 1500

## 2022-05-03 NOTE — Discharge Instructions (Addendum)
It was a pleasure taking care of you today. Your EKG and blood work were not concerning for a heart attack at this time, but it is still important for you to follow up with your cardiologist within 48 hours. CT showed a nodule on your 5th left rib. Please follow up with your primary care provider to evaluate CT findings. Seek emergency care if experiencing new or worsening symptoms.

## 2022-05-05 ENCOUNTER — Telehealth: Payer: Self-pay | Admitting: Family Medicine

## 2022-05-05 NOTE — Telephone Encounter (Signed)
Called patient to schedule Medicare Annual Wellness Visit (AWV). Left message for patient to call back and schedule Medicare Annual Wellness Visit (AWV).  Last date of AWV: 05/04/21  Please schedule an appointment at any time with [facility].  If any questions, please contact me at [phone].  Thank you ,  Rudell Cobb AWV direct phone # [phone]

## 2022-05-09 ENCOUNTER — Telehealth: Payer: Self-pay | Admitting: Family Medicine

## 2022-05-09 NOTE — Telephone Encounter (Signed)
Contacted Ardeth Sportsman to schedule their annual wellness visit. Appointment made for 05/19/22.  Rudell Cobb AWV direct phone # [phone]

## 2022-05-11 ENCOUNTER — Ambulatory Visit (INDEPENDENT_AMBULATORY_CARE_PROVIDER_SITE_OTHER): Payer: Medicare Other | Admitting: Family Medicine

## 2022-05-11 ENCOUNTER — Encounter: Payer: Self-pay | Admitting: Family Medicine

## 2022-05-11 VITALS — BP 132/70 | HR 60 | Temp 98.0°F | Ht 73.0 in | Wt 242.4 lb

## 2022-05-11 DIAGNOSIS — K219 Gastro-esophageal reflux disease without esophagitis: Secondary | ICD-10-CM | POA: Diagnosis not present

## 2022-05-11 DIAGNOSIS — R911 Solitary pulmonary nodule: Secondary | ICD-10-CM | POA: Diagnosis not present

## 2022-05-11 DIAGNOSIS — Z09 Encounter for follow-up examination after completed treatment for conditions other than malignant neoplasm: Secondary | ICD-10-CM | POA: Diagnosis not present

## 2022-05-11 NOTE — Progress Notes (Signed)
Established Patient Office Visit   Subjective:  Patient ID: Jeffrey Poole, male    DOB: 1950/11/26  Age: 72 y.o. MRN: [id number]  Chief Complaint  Patient presents with   Hospitalization Follow-up    Hospital follow up seen for chest pains. No more chest pains recommended to se PCP for growth on rib.     HPI Encounter Diagnoses  Name Primary?   Lung nodule seen on imaging study Yes   Gastroesophageal reflux disease without esophagitis    Hospital discharge follow-up    For follow-up of the emergency room evaluation for soreness in his chest after cutting wood with a chainsaw.  There is no accompanying shortness of breath nausea or diaphoresis.  He does have documented coronary artery disease.  He is compliant with amlodipine and atorvastatin.  He did not take a sublingual nitroglycerin.  He is taking Prevacid for reflux disease for some time now.  He has rebound symptoms when he tries to stop it.  Troponins were negative.  Status post follow-up with the cardiology further studies are planned.  Portable chest x-ray with questionable mass anterior to the left fifth rib.  He has never smoked.  He is active physically by walking and jogging.   Review of Systems  Constitutional: Negative.   HENT: Negative.    Eyes:  Negative for blurred vision, discharge and redness.  Respiratory: Negative.    Cardiovascular: Negative.   Gastrointestinal:  Negative for abdominal pain.  Genitourinary: Negative.   Musculoskeletal: Negative.  Negative for myalgias.  Skin:  Negative for rash.  Neurological:  Negative for tingling, loss of consciousness and weakness.  Endo/Heme/Allergies:  Negative for polydipsia.     Current Outpatient Medications:    aspirin EC 81 MG tablet, Take 1 tablet (81 mg total) by mouth daily., Disp: 90 tablet, Rfl: 3   lansoprazole (PREVACID) 30 MG capsule, TAKE 1 CAPSULE BY MOUTH ONCE DAILY . APPOINTMENT REQUIRED FOR FUTURE REFILLS, Disp: 30 capsule, Rfl: 1   nitroGLYCERIN  (NITROSTAT) 0.4 MG SL tablet, Place 1 tablet (0.4 mg total) under the tongue every 5 (five) minutes as needed., Disp: 25 tablet, Rfl: 3   rosuvastatin (CRESTOR) 10 MG tablet, Take 1 tablet (10 mg total) by mouth 2 (two) times a week., Disp: 30 tablet, Rfl: 3   Objective:     BP 132/70 (BP Location: Left Arm, Patient Position: Sitting, Cuff Size: Large)   Pulse 60   Temp 98 F (36.7 C) (Temporal)   Ht  (1.854 m)   Wt 242 lb 6.4 oz (110 kg)   SpO2 98%   BMI 31.98 kg/m    Physical Exam Constitutional:      General: He is not in acute distress.    Appearance: Normal appearance. He is not ill-appearing, toxic-appearing or diaphoretic.  HENT:     Head: Normocephalic and atraumatic.     Right Ear: External ear normal.     Left Ear: External ear normal.     Mouth/Throat:     Mouth: Mucous membranes are moist.     Pharynx: Oropharynx is clear. No oropharyngeal exudate or posterior oropharyngeal erythema.  Eyes:     General: No scleral icterus.       Right eye: No discharge.        Left eye: No discharge.     Extraocular Movements: Extraocular movements intact.     Conjunctiva/sclera: Conjunctivae normal.     Pupils: Pupils are equal, round, and reactive to light.  Cardiovascular:  Rate and Rhythm: Normal rate and regular rhythm.  Pulmonary:     Effort: Pulmonary effort is normal. No respiratory distress.     Breath sounds: Normal breath sounds.  Abdominal:     General: Bowel sounds are normal.  Musculoskeletal:     Cervical back: No rigidity or tenderness.  Skin:    General: Skin is warm and dry.  Neurological:     Mental Status: He is alert and oriented to person, place, and time.  Psychiatric:        Mood and Affect: Mood normal.        Behavior: Behavior normal.      No results found for any visits on 05/11/22.    The 10-year ASCVD risk score (Arnett DK, et al., 2019) is: 21.1%    Assessment & Plan:   Lung nodule seen on imaging study -     CT CHEST  NODULE FOLLOW UP WO CONTRAST; Future  Gastroesophageal reflux disease without esophagitis  Hospital discharge follow-up    Return in about 6 months (around 11/10/2022).  Discussed tapering procedure for PPI.  Continue appropriate follow-up with cardiology.  Mliss Sax, MD

## 2022-05-19 ENCOUNTER — Ambulatory Visit (INDEPENDENT_AMBULATORY_CARE_PROVIDER_SITE_OTHER): Payer: Medicare Other

## 2022-05-19 VITALS — Ht 75.0 in | Wt 230.0 lb

## 2022-05-19 DIAGNOSIS — Z0000 Encounter for general adult medical examination without abnormal findings: Secondary | ICD-10-CM | POA: Diagnosis not present

## 2022-05-19 NOTE — Progress Notes (Signed)
I connected with  Ardeth Sportsman on 05/19/22 by a audio enabled telemedicine application and verified that I am speaking with the correct person using two identifiers.  Patient Location: Home  Provider Location: Office/Clinic  I discussed the limitations of evaluation and management by telemedicine. The patient expressed understanding and agreed to proceed.  Subjective:   Jeffrey Poole is a 72 y.o. male who presents for Medicare Annual/Subsequent preventive examination.  Patient Medicare AWV questionnaire was completed by the patient on 05/19/2022; I have confirmed that all information answered by patient is correct and no changes since this date.    Review of Systems     Cardiac Risk Factors include: advanced age (>37men, >60 women);hypertension;male gender     Objective:    Today's Vitals   05/19/22 1556  Weight: 230 lb (104.3 kg)  Height: 6\' 3"  (1.905 m)   Body mass index is 28.75 kg/m.     05/19/2022    3:59 PM 05/03/2022    3:38 PM 05/04/2021    3:20 PM 08/01/2019    4:21 PM  Advanced Directives  Does Patient Have a Medical Advance Directive? Yes No Yes No  Type of Estate agent of [place];Living will  Healthcare Power of Sells;Living will   Copy of Healthcare Power of Attorney in Chart? No - copy requested  No - copy requested   Would patient like information on creating a medical advance directive?  No - Patient declined      Current Medications (verified) Outpatient Encounter Medications as of 05/19/2022  Medication Sig   aspirin EC 81 MG tablet Take 1 tablet (81 mg total) by mouth daily.   lansoprazole (PREVACID) 30 MG capsule TAKE 1 CAPSULE BY MOUTH ONCE DAILY . APPOINTMENT REQUIRED FOR FUTURE REFILLS   nitroGLYCERIN (NITROSTAT) 0.4 MG SL tablet Place 1 tablet (0.4 mg total) under the tongue every 5 (five) minutes as needed.   rosuvastatin (CRESTOR) 10 MG tablet Take 1 tablet (10 mg total) by mouth 2 (two) times a week.   No  facility-administered encounter medications on file as of 05/19/2022.    Allergies (verified) Patient has no known allergies.   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/08/2018   Trigger finger of all digits of both hands 05/07/2017   Past Surgical History:  Procedure Laterality Date   BACK SURGERY     RETINAL DETACHMENT SURGERY     Family History  Problem Relation Age of Onset   Hypertension Mother    Hypertension Brother    Heart Problems Maternal Uncle    Social History   Socioeconomic History   Marital status: Married    Spouse name: Not on file   Number of children: Not on file   Years of education: Not on file   Highest education level: Not on file  Occupational History   Not on file  Tobacco Use   Smoking status: Never   Smokeless tobacco: Never  Vaping Use   Vaping Use: Never used  Substance and Sexual Activity   Alcohol use: Never   Drug use: Never   Sexual activity: Not on file  Other Topics Concern   Not on  file  Social History Narrative   Not on file   Social Determinants of Health   Financial Resource Strain: Patient Declined (05/19/2022)   Overall Financial Resource Strain (CARDIA)    Difficulty of Paying Living Expenses: Patient declined  Food Insecurity: Patient Declined (05/19/2022)   Hunger Vital Sign    Worried About Running Out of Food in the Last Year: Patient declined    Ran Out of Food in the Last Year: Patient declined  Transportation Needs: Patient Declined (05/19/2022)   PRAPARE - Administrator, Civil Service (Medical): Patient declined    Lack of Transportation (Non-Medical):  Patient declined  Physical Activity: Patient Declined (05/19/2022)   Exercise Vital Sign    Days of Exercise per Week: Patient declined    Minutes of Exercise per Session: Patient declined  Recent Concern: Physical Activity - Insufficiently Active (05/09/2022)   Exercise Vital Sign    Days of Exercise per Week: 3 days    Minutes of Exercise per Session: 30 min  Stress: Patient Declined (05/19/2022)   [employer] of Occupational Health - Occupational Stress Questionnaire    Feeling of Stress : Patient declined  Social Connections: Patient Declined (05/19/2022)   Social Connection and Isolation Panel [NHANES]    Frequency of Communication with Friends and Family: Patient declined    Frequency of Social Gatherings with Friends and Family: Patient declined    Attends Religious Services: Patient declined    Database administrator or Organizations: Patient declined    Attends Engineer, structural: Patient declined    Marital Status: Patient declined    Tobacco Counseling Counseling given: Not Answered   Clinical Intake:  Pre-visit preparation completed: Yes  Pain : No/denies pain     Nutritional Status: BMI 25 -29 Overweight Nutritional Risks: None Diabetes: No  How often do you need to have someone help you when you read instructions, pamphlets, or other written materials from your doctor or pharmacy?: 1 - Never  Diabetic? no  Interpreter Needed?: No  Information entered by :: NAllen LPN   Activities of Daily Living    05/19/2022    3:06 PM  In your present state of health, do you have any difficulty performing the following activities:  Hearing? 0  Vision? 0  Difficulty concentrating or making decisions? 0  Walking or climbing stairs? 0  Dressing or bathing? 0  Doing errands, shopping? 0  Preparing Food and eating ? N  Using the Toilet? N  In the past six months, have you accidently leaked urine? N  Do you have problems with loss of bowel control? N   Managing your Medications? N  Managing your Finances? N  Housekeeping or managing your Housekeeping? N    Patient Care Team: Mliss Sax, MD as PCP - General (Family Medicine) Dulce Sellar Iline Oven, MD as PCP - Cardiology (Cardiology)  Indicate any recent Medical Services you may have received from other than [facility] providers in the past year (date may be approximate).     Assessment:   This is a routine wellness examination for Dwayne.  Hearing/Vision screen Vision Screening - Comments:: Regular eye exams,  Dietary issues and exercise activities discussed:     Goals Addressed             This Visit's Progress    Patient Stated       05/19/2022, no goals       Depression Screen    05/19/2022    3:59 PM  05/11/2022    2:19 PM 12/30/2021    1:05 PM 09/23/2021    3:14 PM 05/04/2021    3:21 PM 05/04/2021    3:18 PM 04/29/2020    2:07 PM  PHQ 2/9 Scores  PHQ - 2 Score 0 0 0 0 0 0 0    Fall Risk    05/19/2022    3:06 PM 05/11/2022    2:19 PM 12/30/2021    1:05 PM 09/23/2021    3:14 PM 05/04/2021    3:20 PM  Fall Risk   Falls in the past year? 0 0 0 0 0  Number falls in past yr: 0 0 0 0 0  Injury with Fall? 0 0 0  0  Risk for fall due to : Medication side effect No Fall Risks No Fall Risks    Follow up Falls prevention discussed;Education provided;Falls evaluation completed Falls evaluation completed Falls evaluation completed  Falls evaluation completed    FALL RISK PREVENTION PERTAINING TO THE HOME:  Any stairs in or around the home? Yes  If so, are there any without handrails? No  Home free of loose throw rugs in walkways, pet beds, electrical cords, etc? Yes  Adequate lighting in your home to reduce risk of falls? Yes   ASSISTIVE DEVICES UTILIZED TO PREVENT FALLS:  Life alert? No  Use of a cane, walker or w/c? No  Grab bars in the bathroom? No  Shower chair or bench in shower? No  Elevated toilet seat or a handicapped toilet? Yes   TIMED UP AND GO:  Was  the test performed? No .       Cognitive Function:        05/19/2022    4:00 PM  6CIT Screen  What Year? 0 points  What month? 0 points  What time? 0 points  Count back from 20 0 points  Months in reverse 0 points  Repeat phrase 0 points  Total Score 0 points    Immunizations Immunization History  Administered Date(s) Administered   Influenza, High Dose Seasonal PF 12/04/2016   Pneumococcal Conjugate-13 12/04/2016    TDAP status: Up to date  Flu Vaccine status: Declined, Education has been provided regarding the importance of this vaccine but patient still declined. Advised may receive this vaccine at local pharmacy or Health Dept. Aware to provide a copy of the vaccination record if obtained from local pharmacy or Health Dept. Verbalized acceptance and understanding.  Pneumococcal vaccine status: Declined,  Education has been provided regarding the importance of this vaccine but patient still declined. Advised may receive this vaccine at local pharmacy or Health Dept. Aware to provide a copy of the vaccination record if obtained from local pharmacy or Health Dept. Verbalized acceptance and understanding.   Covid-19 vaccine status: Declined, Education has been provided regarding the importance of this vaccine but patient still declined. Advised may receive this vaccine at local pharmacy or Health Dept.or vaccine clinic. Aware to provide a copy of the vaccination record if obtained from local pharmacy or Health Dept. Verbalized acceptance and understanding.  Qualifies for Shingles Vaccine? Yes   Zostavax completed No   Shingrix Completed?: No.    Education has been provided regarding the importance of this vaccine. Patient has been advised to call insurance company to determine out of pocket expense if they have not yet received this vaccine. Advised may also receive vaccine at local pharmacy or Health Dept. Verbalized acceptance and understanding.  Screening Tests Health  Maintenance  Topic  Date Due   Medicare Annual Wellness (AWV)  05/05/2022   COLONOSCOPY (Pts 45-22yrs Insurance coverage will need to be confirmed)  01/16/2025   Hepatitis C Screening  Completed   HPV VACCINES  Aged Out   DTaP/Tdap/Td  Discontinued   Pneumonia Vaccine 27+ Years old  Discontinued   INFLUENZA VACCINE  Discontinued   COVID-19 Vaccine  Discontinued   Zoster Vaccines- Shingrix  Discontinued    Health Maintenance  Health Maintenance Due  Topic Date Due   Medicare Annual Wellness (AWV)  05/05/2022    Colorectal cancer screening: Type of screening: Colonoscopy. Completed 01/17/2015. Repeat every 10 years  Lung Cancer Screening: (Low Dose CT Chest recommended if Age 56-80 years, 30 pack-year currently smoking OR have quit w/in 15years.) does not qualify.   Lung Cancer Screening Referral: no  Additional Screening:  Hepatitis C Screening: does qualify; Completed 05/07/2017  Vision Screening: Recommended annual ophthalmology exams for early detection of glaucoma and other disorders of the eye. Is the patient up to date with their annual eye exam?  Yes  Who is the provider or what is the name of the office in which the patient attends annual eye exams?  If pt is not established with a provider, would they like to be referred to a provider to establish care? No .   Dental Screening: Recommended annual dental exams for proper oral hygiene  Community Resource Referral / Chronic Care Management: CRR required this visit?  No   CCM required this visit?  No      Plan:     I have personally reviewed and noted the following in the patient's chart:   Medical and social history Use of alcohol, tobacco or illicit drugs  Current medications and supplements including opioid prescriptions. Patient is not currently taking opioid prescriptions. Functional ability and status Nutritional status Physical activity Advanced directives List of other physicians Hospitalizations,  surgeries, and ER visits in previous 12 months Vitals Screenings to include cognitive, depression, and falls Referrals and appointments  In addition, I have reviewed and discussed with patient certain preventive protocols, quality metrics, and best practice recommendations. A written personalized care plan for preventive services as well as general preventive health recommendations were provided to patient.     Barb Merino, LPN   09/18/2353   Nurse Notes: none  Due to this being a virtual visit, the after visit summary with patients personalized plan was offered to patient via mail or my-chart. Patient would like to access on my-chart

## 2022-05-19 NOTE — Patient Instructions (Signed)
Jeffrey Poole , Thank you for taking time to come for your Medicare Wellness Visit. I appreciate your ongoing commitment to your health goals. Please review the following plan we discussed and let me know if I can assist you in the future.   These are the goals we discussed:  Goals      Patient Stated     05/19/2022, no goals        This is a list of the screening recommended for you and due dates:  Health Maintenance  Topic Date Due   Medicare Annual Wellness Visit  05/19/2023   Colon Cancer Screening  01/16/2025   Hepatitis C Screening: USPSTF Recommendation to screen - Ages 18-79 yo.  Completed   HPV Vaccine  Aged Out   DTaP/Tdap/Td vaccine  Discontinued   Pneumonia Vaccine  Discontinued   Flu Shot  Discontinued   COVID-19 Vaccine  Discontinued   Zoster (Shingles) Vaccine  Discontinued    Advanced directives: Please bring a copy of your POA (Power of Attorney) and/or Living Will to your next appointment.   Conditions/risks identified: none  Next appointment: Follow up in one year for your annual wellness visit.   Preventive Care 63 Years and Older, Male  Preventive care refers to lifestyle choices and visits with your health care provider that can promote health and wellness. What does preventive care include? A yearly physical exam. This is also called an annual well check. Dental exams once or twice a year. Routine eye exams. Ask your health care provider how often you should have your eyes checked. Personal lifestyle choices, including: Daily care of your teeth and gums. Regular physical activity. Eating a healthy diet. Avoiding tobacco and drug use. Limiting alcohol use. Practicing safe sex. Taking low doses of aspirin every day. Taking vitamin and mineral supplements as recommended by your health care provider. What happens during an annual well check? The services and screenings done by your health care provider during your annual well check will depend on your age,  overall health, lifestyle risk factors, and family history of disease. Counseling  Your health care provider may ask you questions about your: Alcohol use. Tobacco use. Drug use. Emotional well-being. Home and relationship well-being. Sexual activity. Eating habits. History of falls. Memory and ability to understand (cognition). Work and work Astronomer. Screening  You may have the following tests or measurements: Height, weight, and BMI. Blood pressure. Lipid and cholesterol levels. These may be checked every 5 years, or more frequently if you are over 76 years old. Skin check. Lung cancer screening. You may have this screening every year starting at age 28 if you have a 30-pack-year history of smoking and currently smoke or have quit within the past 15 years. Fecal occult blood test (FOBT) of the stool. You may have this test every year starting at age 82. Flexible sigmoidoscopy or colonoscopy. You may have a sigmoidoscopy every 5 years or a colonoscopy every 10 years starting at age 33. Prostate cancer screening. Recommendations will vary depending on your family history and other risks. Hepatitis C blood test. Hepatitis B blood test. Sexually transmitted disease (STD) testing. Diabetes screening. This is done by checking your blood sugar (glucose) after you have not eaten for a while (fasting). You may have this done every 1-3 years. Abdominal aortic aneurysm (AAA) screening. You may need this if you are a current or former smoker. Osteoporosis. You may be screened starting at age 74 if you are at high risk. Talk with  your health care provider about your test results, treatment options, and if necessary, the need for more tests. Vaccines  Your health care provider may recommend certain vaccines, such as: Influenza vaccine. This is recommended every year. Tetanus, diphtheria, and acellular pertussis (Tdap, Td) vaccine. You may need a Td booster every 10 years. Zoster vaccine.  You may need this after age 74. Pneumococcal 13-valent conjugate (PCV13) vaccine. One dose is recommended after age 43. Pneumococcal polysaccharide (PPSV23) vaccine. One dose is recommended after age 71. Talk to your health care provider about which screenings and vaccines you need and how often you need them. This information is not intended to replace advice given to you by your health care provider. Make sure you discuss any questions you have with your health care provider. Document Released: 01/29/2015 Document Revised: 09/22/2015 Document Reviewed: 11/03/2014 Elsevier Interactive Patient Education  2017 [place] Prevention in the Home Falls can cause injuries. They can happen to people of all ages. There are many things you can do to make your home safe and to help prevent falls. What can I do on the outside of my home? Regularly fix the edges of walkways and driveways and fix any cracks. Remove anything that might make you trip as you walk through a door, such as a raised step or threshold. Trim any bushes or trees on the path to your home. Use bright outdoor lighting. Clear any walking paths of anything that might make someone trip, such as rocks or tools. Regularly check to see if handrails are loose or broken. Make sure that both sides of any steps have handrails. Any raised decks and porches should have guardrails on the edges. Have any leaves, snow, or ice cleared regularly. Use sand or salt on walking paths during winter. Clean up any spills in your garage right away. This includes oil or grease spills. What can I do in the bathroom? Use night lights. Install grab bars by the toilet and in the tub and shower. Do not use towel bars as grab bars. Use non-skid mats or decals in the tub or shower. If you need to sit down in the shower, use a plastic, non-slip stool. Keep the floor dry. Clean up any water that spills on the floor as soon as it happens. Remove soap  buildup in the tub or shower regularly. Attach bath mats securely with double-sided non-slip rug tape. Do not have throw rugs and other things on the floor that can make you trip. What can I do in the bedroom? Use night lights. Make sure that you have a light by your bed that is easy to reach. Do not use any sheets or blankets that are too big for your bed. They should not hang down onto the floor. Have a firm chair that has side arms. You can use this for support while you get dressed. Do not have throw rugs and other things on the floor that can make you trip. What can I do in the kitchen? Clean up any spills right away. Avoid walking on wet floors. Keep items that you use a lot in easy-to-reach places. If you need to reach something above you, use a strong step stool that has a grab bar. Keep electrical cords out of the way. Do not use floor polish or wax that makes floors slippery. If you must use wax, use non-skid floor wax. Do not have throw rugs and other things on the floor that can make you trip.  What can I do with my stairs? Do not leave any items on the stairs. Make sure that there are handrails on both sides of the stairs and use them. Fix handrails that are broken or loose. Make sure that handrails are as long as the stairways. Check any carpeting to make sure that it is firmly attached to the stairs. Fix any carpet that is loose or worn. Avoid having throw rugs at the top or bottom of the stairs. If you do have throw rugs, attach them to the floor with carpet tape. Make sure that you have a light switch at the top of the stairs and the bottom of the stairs. If you do not have them, ask someone to add them for you. What else can I do to help prevent falls? Wear shoes that: Do not have high heels. Have rubber bottoms. Are comfortable and fit you well. Are closed at the toe. Do not wear sandals. If you use a stepladder: Make sure that it is fully opened. Do not climb a closed  stepladder. Make sure that both sides of the stepladder are locked into place. Ask someone to hold it for you, if possible. Clearly mark and make sure that you can see: Any grab bars or handrails. First and last steps. Where the edge of each step is. Use tools that help you move around (mobility aids) if they are needed. These include: Canes. Walkers. Scooters. Crutches. Turn on the lights when you go into a dark area. Replace any light bulbs as soon as they burn out. Set up your furniture so you have a clear path. Avoid moving your furniture around. If any of your floors are uneven, fix them. If there are any pets around you, be aware of where they are. Review your medicines with your doctor. Some medicines can make you feel dizzy. This can increase your chance of falling. Ask your doctor what other things that you can do to help prevent falls. This information is not intended to replace advice given to you by your health care provider. Make sure you discuss any questions you have with your health care provider. Document Released: 10/29/2008 Document Revised: 06/10/2015 Document Reviewed: 02/06/2014 Elsevier Interactive Patient Education  2017 [employer].

## 2022-07-20 ENCOUNTER — Other Ambulatory Visit: Payer: Self-pay | Admitting: Family Medicine

## 2022-07-20 DIAGNOSIS — K219 Gastro-esophageal reflux disease without esophagitis: Secondary | ICD-10-CM

## 2022-09-29 ENCOUNTER — Other Ambulatory Visit: Payer: Self-pay | Admitting: Cardiology

## 2022-10-16 NOTE — Progress Notes (Unsigned)
Cardiology Office Note:    Date:  10/16/2022   ID:  Jeffrey Poole, DOB 28-May-1950, MRN [id number]  PCP:  Mliss Sax, MD  Cardiologist:  Norman Herrlich, MD    Referring MD: Mliss Sax,*    ASSESSMENT:    No diagnosis found. PLAN:    In order of problems listed above:  ***   Next appointment: ***   Medication Adjustments/Labs and Tests Ordered: Current medicines are reviewed at length with the patient today.  Concerns regarding medicines are outlined above.  No orders of the defined types were placed in this encounter.  No orders of the defined types were placed in this encounter.    History of Present Illness:    Jeffrey Poole is a 72 y.o. male with a hx of disease with mild enlargement of the thoracic aorta hypertension hyperlipidemia last seen 09/29/2021.  Previous cardiac CTA showed severe obstructive LAD disease the patient declined left heart catheterization intervention and chose medical therapy. 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      EKGs/Labs/Other Studies Reviewed:    The following studies were reviewed today:  Cardiac Studies & Procedures       ECHOCARDIOGRAM  ECHOCARDIOGRAM COMPLETE 07/03/2018  Narrative ECHOCARDIOGRAM REPORT    Patient Name:   Jeffrey Poole Date of Exam: 07/03/2018 Medical Rec #:  [id number]   Height:       75.0  in Accession #:    [id number]  Weight:       251.8 lb Date of Birth:  06-23-1950   BSA:          2.42 m Patient Age:    72 years    BP:           138/84 mmHg Patient Gender: M           HR:           55 bpm. Exam Location:  [place]   Procedure: 2D Echo  Indications:    Dyspnea  History:        Patient has no prior history of Echocardiogram examinations. Palpitation Risk Factors: Hypertension.  Sonographer:    Sinda Du RDCS (AE) Referring Phys: [id number] Catheryn Slifer J Azriel Dancy  IMPRESSIONS   1. The left ventricle has normal systolic function, with an ejection fraction of 55-60%. The cavity size was normal. There is mild concentric left ventricular hypertrophy. Left ventricular diastolic Doppler parameters are consistent with impaired relaxation. 2. The right ventricle has normal systolic function. The cavity was normal. There is no increase in right ventricular wall thickness. 3. The aortic root and aortic arch are normal in size and structure. 4. There is mild to moderate dilatation of the ascending aorta measuring 40 mm.  FINDINGS Left Ventricle: The left ventricle has normal systolic function, with an ejection fraction of 55-60%. The cavity size was normal. There is mild concentric left ventricular hypertrophy. Left ventricular diastolic Doppler parameters  are consistent with impaired relaxation. Normal left ventricular filling pressures  LV Wall Scoring: All segments are normal.   Right Ventricle: The right ventricle has normal systolic function. The cavity was normal. There is no increase in right ventricular wall thickness.  Left Atrium: Left atrial size was normal in size.  Right Atrium: Right atrial size was normal in size. Right atrial pressure is estimated at 3 mmHg.  Interatrial Septum: No atrial level shunt detected by color flow Doppler.  Pericardium: There is no evidence of pericardial effusion.  Mitral Valve: The mitral valve is normal in structure. Mitral  valve regurgitation is mild by color flow Doppler. No evidence of mitral valve stenosis.  Tricuspid Valve: The tricuspid valve is normal in structure. Tricuspid valve regurgitation was not visualized by color flow Doppler.  Aortic Valve: The aortic valve is tricuspid Aortic valve regurgitation was not visualized by color flow Doppler. There is No stenosis of the aortic valve.  Pulmonic Valve: The pulmonic valve was grossly normal. Pulmonic valve regurgitation is not visualized by color flow Doppler. No evidence of pulmonic stenosis.  Aorta: The aortic root and aortic arch are normal in size and structure. There is mild to moderate dilatation of the ascending aorta measuring 40 mm.  Venous: The inferior vena cava measures 1.74 cm, is normal in size with greater than 50% respiratory variability.   +--------------+--------++ LEFT VENTRICLE         +----------------+---------++ +--------------+--------++ Diastology                PLAX 2D                +----------------+---------++ +--------------+--------++ LV e' lateral:  9.38 cm/s LVIDd:        5.38 cm  +----------------+---------++ +--------------+--------++ LV E/e' lateral:4.2       LVIDs:        3.49 cm  +----------------+---------++ +--------------+--------++ LV e' medial:   6.45 cm/s LV PW:        1.27 cm  +----------------+---------++ +--------------+--------++ LV E/e' medial: 6.1       LV IVS:       1.20 cm  +----------------+---------++ +--------------+--------++ LVOT diam:    2.10 cm  +--------------+--------++ LV SV:        90 ml    +--------------+--------++ LV SV Index:  36.05    +--------------+--------++ LVOT Area:    3.46 cm +--------------+--------++                        +--------------+--------++  +---------------+----------++ RIGHT VENTRICLE           +---------------+----------++ RV S prime:    13.20  cm/s +---------------+----------++ TAPSE (M-mode):2.4 cm     +---------------+----------++  +-------------+-------++-----------++ LEFT ATRIUM         Index       +-------------+-------++-----------++ LA diam:     3.80 cm1.57 cm/m  +-------------+-------++-----------++ LA Vol (A2C):64.2 ml26.53 ml/m +-------------+-------++-----------++ +------------+---------++-----------++ RIGHT ATRIUM         Index       +------------+---------++-----------++ RA Area:    18.30 cm            +------------+---------++-----------++ RA Volume:  50.00 ml 20.66 ml/m +------------+---------++-----------++ +------------+-----------++ AORTIC VALVE            +------------+-----------++ LVOT Vmax:  69.60 cm/s  +------------+-----------++ LVOT Vmean: 47.900 cm/s +------------+-----------++ LVOT VTI:   0.155 m     +------------+-----------++  +-------------+-------++ AORTA                +-------------+-------++  Ao Root diam:3.50 cm +-------------+-------++ Ao Asc diam: 4.10 cm +-------------+-------++  +--------------+----------++ MITRAL VALVE             +--------------+-------+ +--------------+----------++ SHUNTS                MV Area (PHT):2.32 cm   +--------------+-------+ +--------------+----------++ Systemic VTI: 0.16 m  MV PHT:       94.83 msec +--------------+-------+ +--------------+----------++ Systemic Diam:2.10 cm MV Decel Time:327 msec   +--------------+-------+ +--------------+----------++ +--------------+----------++ MV E velocity:39.60 cm/s +--------------+----------++ MV A velocity:44.20 cm/s +--------------+----------++ MV E/A ratio: 0.90       +--------------+----------++  +---------+-------+ IVC              +---------+-------+ IVC diam:1.74 cm +---------+-------+   Norman Herrlich MD Electronically signed by Norman Herrlich MD Signature Date/Time:  07/03/2018/11:47:41 AM    Final     CT SCANS  CT CORONARY MORPH W/CTA COR W/SCORE 08/07/2018  Addendum 08/08/2018  7:00 PM ADDENDUM REPORT: 08/08/2018 18:57  CLINICAL DATA:  72 yo male with history of hypertension, now with chest pain  EXAM: Cardiac/Coronary CT  TECHNIQUE: The patient was scanned on a [employer].  FINDINGS: A 120 kV prospective scan was triggered in the descending thoracic aorta at 111 HU's. Axial non-contrast 3 mm slices were carried out through the heart. The data set was analyzed on a dedicated work station and scored using the Agatson method. Gantry rotation speed was 250 msecs and collimation was .6 mm. No beta blockade and 0.8 mg of sl NTG was given. The 3D data set was reconstructed in 5% intervals of the 67-82 % of the R-R cycle. Diastolic phases were analyzed on a dedicated work station using MPR, MIP and VRT modes. The patient received 80 cc of contrast.  Aorta: Borderline dilated at 3.9 cm. No calcifications. No dissection.  Aortic Valve:  Trileaflet.  No calcifications.  Coronary Arteries:  Normal coronary origin.  Right dominance.  RCA is a large dominant artery that gives rise to PDA and PLVB. There is focal calcification and a long-segment of non-calcified plaque with 25-49% stenosis (CADRADS2), followed by a more significant calcified 50-69% stenosis (CADRADS3) in the proximal to mid segment.  Left main is a large artery that gives rise to LAD and LCX arteries.  LAD is a large vessel that is calcified proximally with likely severe stenosis 70-99% just proximal to the takeoff of a moderate-sized diagonal vessel. There is also a calcified plaque at the bifurcation with D1 with moderate stenosis 50-69% (CADRADS3). There is no significant stenosis of the diagonal.  LCX is a non-dominant artery which is tortuous and gives rise to small OM branches. There is no significant obstructive disease.  Other findings:  Normal  pulmonary vein drainage into the left atrium.  Normal left atrial appendage without a thrombus.  Normal size of the pulmonary artery.  IMPRESSION: 1. Coronary calcium score of 167. This was 56th percentile for age and sex matched control. Calcium is seen in the LAD and RCA.  2.  Right dominant circulation.  3. Severe calcified stenosis of the proximal to mid LAD (CADRADS 4) just proximal to the takeoff of the D1 vessel.  4. Sequential mild to moderate calcified, non-obstructive stenoses of the proximal to mid RCA (CADRADS3).  5.  Borderline dilated aortic root at 3.9 cm.  5.  Recommend cardiac catheterization.  Zoila Shutter   Electronically Signed By: Chrystie Nose M.D. On: 08/08/2018 18:57  Narrative EXAM: OVER-READ INTERPRETATION  CT CHEST  The  following report is an over-read performed by radiologist Dr. Charlett Nose of [facility] on 08/07/2018. This over-read does not include interpretation of cardiac or coronary anatomy or pathology. The coronary CTA interpretation by the cardiologist is attached.  COMPARISON:  07/24/2018  FINDINGS: Vascular: Heart is borderline enlarged. Visualized aorta normal caliber.  Mediastinum/Nodes: No adenopathy in the lower mediastinum or hila.  Lungs/Pleura: Visualized lungs clear.  No effusions.  Upper Abdomen: Imaging into the upper abdomen shows no acute findings.  Musculoskeletal: Chest wall soft tissues are unremarkable. No acute bony abnormality.  IMPRESSION: No acute or significant extracardiac abnormality.  Electronically Signed: By: Charlett Nose M.D. On: 08/07/2018 16:22              Recent Labs: 05/03/2022: ALT 29; BUN 13; Creatinine, Ser 0.93; Hemoglobin 16.3; Platelets 227; Potassium 3.8; Sodium 136  Recent Lipid Panel    Component Value Date/Time   CHOL 141 03/14/2022 1329   TRIG 155 (H) 03/14/2022 1329   HDL 44 03/14/2022 1329   CHOLHDL 3.2 03/14/2022 1329   CHOLHDL 3.7 09/23/2021  1622   VLDL 26.0 05/07/2017 1608   LDLCALC 70 03/14/2022 1329   LDLCALC 106 (H) 09/23/2021 1622   LDLDIRECT 60 04/29/2020 1449    Physical Exam:    VS:  There were no vitals taken for this visit.    Wt Readings from Last 3 Encounters:  05/19/22 230 lb (104.3 kg)  05/11/22 242 lb 6.4 oz (110 kg)  05/03/22 230 lb (104.3 kg)     GEN: *** Well nourished, well developed in no acute distress HEENT: Normal NECK: No JVD; No carotid bruits LYMPHATICS: No lymphadenopathy CARDIAC: ***RRR, no murmurs, rubs, gallops RESPIRATORY:  Clear to auscultation without rales, wheezing or rhonchi  ABDOMEN: Soft, non-tender, non-distended MUSCULOSKELETAL:  No edema; No deformity  SKIN: Warm and dry NEUROLOGIC:  Alert and oriented x 3 PSYCHIATRIC:  Normal affect    Signed, Norman Herrlich, MD  10/16/2022 2:56 PM     [facility]

## 2022-10-17 ENCOUNTER — Encounter: Payer: Self-pay | Admitting: Cardiology

## 2022-10-17 ENCOUNTER — Ambulatory Visit: Payer: Medicare Other | Attending: Cardiology | Admitting: Cardiology

## 2022-10-17 VITALS — BP 138/80 | HR 57 | Ht 75.0 in | Wt 240.0 lb

## 2022-10-17 DIAGNOSIS — I7789 Other specified disorders of arteries and arterioles: Secondary | ICD-10-CM

## 2022-10-17 DIAGNOSIS — E782 Mixed hyperlipidemia: Secondary | ICD-10-CM

## 2022-10-17 DIAGNOSIS — I25118 Atherosclerotic heart disease of native coronary artery with other forms of angina pectoris: Secondary | ICD-10-CM

## 2022-10-17 DIAGNOSIS — I10 Essential (primary) hypertension: Secondary | ICD-10-CM

## 2022-10-17 NOTE — Patient Instructions (Signed)
Medication Instructions:  Your physician recommends that you continue on your current medications as directed. Please refer to the Current Medication list given to you today.  *If you need a refill on your cardiac medications before your next appointment, please call your pharmacy*   Lab Work: Your physician recommends that you return for lab work in:   Labs today: CMP, Lipids  If you have labs (blood work) drawn today and your tests are completely normal, you will receive your results only by: MyChart Message (if you have MyChart) OR A paper copy in the mail If you have any lab test that is abnormal or we need to change your treatment, we will call you to review the results.   Testing/Procedures: None   Follow-Up: At [facility], you and your health needs are our priority.  As part of our continuing mission to provide you with exceptional heart care, we have created designated Provider Care Teams.  These Care Teams include your primary Cardiologist (physician) and Advanced Practice Providers (APPs -  Physician Assistants and Nurse Practitioners) who all work together to provide you with the care you need, when you need it.  We recommend signing up for the patient portal called "MyChart".  Sign up information is provided on this After Visit Summary.  MyChart is used to connect with patients for Virtual Visits (Telemedicine).  Patients are able to view lab/test results, encounter notes, upcoming appointments, etc.  Non-urgent messages can be sent to your provider as well.   To learn more about what you can do with MyChart, go to [email or web address].    Your next appointment:   1 year(s)  Provider:   Norman Herrlich, MD    Other Instructions Check home blood pressures daily and record.

## 2022-10-18 LAB — LIPID PANEL
Chol/HDL Ratio: 2.7 {ratio} (ref 0.0–5.0)
Cholesterol, Total: 131 mg/dL (ref 100–199)
HDL: 48 mg/dL (ref >39)
LDL Chol Calc (NIH): 61 mg/dL (ref 0–99)
Triglycerides: 121 mg/dL (ref 0–149)
VLDL Cholesterol Cal: 22 mg/dL (ref 5–40)

## 2022-10-18 LAB — COMPREHENSIVE METABOLIC PANEL
ALT: 27 [IU]/L (ref 0–44)
AST: 25 [IU]/L (ref 0–40)
Albumin: 4.5 g/dL (ref 3.8–4.8)
Alkaline Phosphatase: 88 [IU]/L (ref 44–121)
BUN/Creatinine Ratio: 12 (ref 10–24)
BUN: 11 mg/dL (ref 8–27)
Bilirubin Total: 0.8 mg/dL (ref 0.0–1.2)
CO2: 22 mmol/L (ref 20–29)
Calcium: 9.8 mg/dL (ref 8.6–10.2)
Chloride: 101 mmol/L (ref 96–106)
Creatinine, Ser: 0.95 mg/dL (ref 0.76–1.27)
Globulin, Total: 3.1 g/dL (ref 1.5–4.5)
Glucose: 98 mg/dL (ref 70–99)
Potassium: 4.6 mmol/L (ref 3.5–5.2)
Sodium: 140 mmol/L (ref 134–144)
Total Protein: 7.6 g/dL (ref 6.0–8.5)
eGFR: 85 mL/min/{1.73_m2} (ref >59)

## 2022-11-14 ENCOUNTER — Encounter: Payer: Self-pay | Admitting: Cardiology

## 2022-11-15 ENCOUNTER — Encounter: Payer: Self-pay | Admitting: Cardiology

## 2022-11-15 DIAGNOSIS — I10 Essential (primary) hypertension: Secondary | ICD-10-CM

## 2022-11-17 ENCOUNTER — Other Ambulatory Visit: Payer: Self-pay | Admitting: Family Medicine

## 2022-11-17 DIAGNOSIS — K219 Gastro-esophageal reflux disease without esophagitis: Secondary | ICD-10-CM

## 2022-11-20 ENCOUNTER — Other Ambulatory Visit: Payer: Self-pay | Admitting: Cardiology

## 2022-11-23 NOTE — Telephone Encounter (Signed)
Patient is calling to f/u regarding this message due to not hearing back. Please advise.

## 2022-11-27 ENCOUNTER — Encounter: Payer: Self-pay | Admitting: Family Medicine

## 2022-11-27 ENCOUNTER — Ambulatory Visit (INDEPENDENT_AMBULATORY_CARE_PROVIDER_SITE_OTHER): Payer: Medicare Other | Admitting: Family Medicine

## 2022-11-27 VITALS — BP 144/86 | HR 85 | Temp 97.3°F | Ht 75.0 in | Wt 244.6 lb

## 2022-11-27 DIAGNOSIS — K219 Gastro-esophageal reflux disease without esophagitis: Secondary | ICD-10-CM

## 2022-11-27 DIAGNOSIS — I10 Essential (primary) hypertension: Secondary | ICD-10-CM | POA: Diagnosis not present

## 2022-11-27 MED ORDER — LANSOPRAZOLE 30 MG PO CPDR
30.0000 mg | DELAYED_RELEASE_CAPSULE | Freq: Every day | ORAL | 2 refills | Status: DC
Start: 2022-11-27 — End: 2023-09-13

## 2022-11-27 MED ORDER — AMLODIPINE BESYLATE 10 MG PO TABS
10.0000 mg | ORAL_TABLET | Freq: Every day | ORAL | 0 refills | Status: DC
Start: 2022-11-27 — End: 2023-02-23

## 2022-11-27 NOTE — Progress Notes (Signed)
Established Patient Office Visit   Subjective:  Patient ID: Jeffrey Poole, male    DOB: 08-20-1950  Age: 72 y.o. MRN: [id number]  Chief Complaint  Patient presents with   Gastroesophageal Reflux    Acid reflux has been worsening.    Hypertension    Elevated blood pressure reading. 145-180 systolic readings per pt.     Gastroesophageal Reflux He reports no abdominal pain.  Hypertension Pertinent negatives include no blurred vision.   Encounter Diagnoses  Name Primary?   Essential hypertension Yes   Gastroesophageal reflux disease without esophagitis    Follow-up of elevated blood pressure.  Has been elevated at home.  History of coronary artery disease.  He had taken a combination product with amlodipine and atorvastatin.  He is now taking rosuvastatin but is no longer taking amlodipine.  He does not smoke, drink alcohol or use illicit drugs.    Review of Systems  Constitutional: Negative.   HENT: Negative.    Eyes:  Negative for blurred vision, discharge and redness.  Respiratory: Negative.    Cardiovascular: Negative.   Gastrointestinal:  Negative for abdominal pain.  Genitourinary: Negative.   Musculoskeletal: Negative.  Negative for myalgias.  Skin:  Negative for rash.  Neurological:  Negative for tingling, loss of consciousness and weakness.  Endo/Heme/Allergies:  Negative for polydipsia.     Current Outpatient Medications:    amLODipine (NORVASC) 10 MG tablet, Take 1 tablet (10 mg total) by mouth daily., Disp: 90 tablet, Rfl: 0   aspirin EC 81 MG tablet, Take 1 tablet (81 mg total) by mouth daily., Disp: 90 tablet, Rfl: 3   lansoprazole (PREVACID) 30 MG capsule, Take 1 capsule (30 mg total) by mouth daily at 12 noon., Disp: 90 capsule, Rfl: 2   nitroGLYCERIN (NITROSTAT) 0.4 MG SL tablet, DISSOLVE ONE TABLET UNDER THE TONGUE EVERY 5 MINUTES AS NEEDED FOR CHEST PAIN.  DO NOT EXCEED A TOTAL OF 3 DOSES IN 15 MINUTES, Disp: 25 tablet, Rfl: 0   rosuvastatin (CRESTOR) 10  MG tablet, Take 1 tablet (10 mg total) by mouth 2 (two) times a week., Disp: 30 tablet, Rfl: 3   Objective:     BP (!) 144/86   Pulse 85   Temp (!) 97.3 F (36.3 C)   Ht 6\' 3"  (1.905 m)   Wt 244 lb 9.6 oz (110.9 kg)   SpO2 95%   BMI 30.57 kg/m  BP Readings from Last 3 Encounters:  11/27/22 (!) 144/86  10/17/22 138/80  05/11/22 132/70   Wt Readings from Last 3 Encounters:  11/27/22 244 lb 9.6 oz (110.9 kg)  10/17/22 240 lb (108.9 kg)  05/19/22 230 lb (104.3 kg)      Physical Exam Constitutional:      General: He is not in acute distress.    Appearance: Normal appearance. He is not ill-appearing, toxic-appearing or diaphoretic.  HENT:     Head: Normocephalic and atraumatic.     Right Ear: External ear normal.     Left Ear: External ear normal.     Mouth/Throat:     Mouth: Mucous membranes are moist.     Pharynx: Oropharynx is clear. No oropharyngeal exudate or posterior oropharyngeal erythema.  Eyes:     General: No scleral icterus.       Right eye: No discharge.        Left eye: No discharge.     Extraocular Movements: Extraocular movements intact.     Conjunctiva/sclera: Conjunctivae normal.  Pupils: Pupils are equal, round, and reactive to light.  Cardiovascular:     Rate and Rhythm: Normal rate and regular rhythm.  Pulmonary:     Effort: Pulmonary effort is normal. No respiratory distress.     Breath sounds: Normal breath sounds.  Abdominal:     General: Bowel sounds are normal.     Tenderness: There is no abdominal tenderness. There is no guarding.  Musculoskeletal:     Cervical back: No rigidity or tenderness.     Right lower leg: No edema.     Left lower leg: No edema.  Skin:    General: Skin is warm and dry.  Neurological:     Mental Status: He is alert and oriented to person, place, and time.  Psychiatric:        Mood and Affect: Mood normal.        Behavior: Behavior normal.      No results found for any visits on 11/27/22.    The  10-year ASCVD risk score (Arnett DK, et al., 2019) is: 24.4%    Assessment & Plan:   Essential hypertension -     amLODIPine Besylate; Take 1 tablet (10 mg total) by mouth daily.  Dispense: 90 tablet; Refill: 0  Gastroesophageal reflux disease without esophagitis -     Lansoprazole; Take 1 capsule (30 mg total) by mouth daily at 12 noon.  Dispense: 90 capsule; Refill: 2    Return in about 8 weeks (around 01/22/2023).  Start amlodipine 10 mg for hypertension with history of coronary artery disease.  It is worked in the past for him.  Continue Prevacid 30 mg daily for reflux.  Declines flu vaccine today.  Mliss Sax, MD

## 2022-12-06 ENCOUNTER — Other Ambulatory Visit: Payer: Self-pay

## 2022-12-29 ENCOUNTER — Encounter: Payer: Self-pay | Admitting: Cardiology

## 2022-12-29 LAB — BASIC METABOLIC PANEL
BUN/Creatinine Ratio: 16 (ref 10–24)
BUN: 16 mg/dL (ref 8–27)
CO2: 19 mmol/L — ABNORMAL LOW (ref 20–29)
Calcium: 9.4 mg/dL (ref 8.6–10.2)
Chloride: 104 mmol/L (ref 96–106)
Creatinine, Ser: 0.99 mg/dL (ref 0.76–1.27)
Glucose: 109 mg/dL — ABNORMAL HIGH (ref 70–99)
Potassium: 4.3 mmol/L (ref 3.5–5.2)
Sodium: 141 mmol/L (ref 134–144)
eGFR: 81 mL/min/{1.73_m2} (ref >59)

## 2023-01-22 ENCOUNTER — Ambulatory Visit (INDEPENDENT_AMBULATORY_CARE_PROVIDER_SITE_OTHER): Payer: Medicare Other | Admitting: Family Medicine

## 2023-01-22 ENCOUNTER — Encounter: Payer: Self-pay | Admitting: Family Medicine

## 2023-01-22 VITALS — BP 124/73 | HR 64 | Temp 98.0°F | Resp 18 | Wt 247.0 lb

## 2023-01-22 DIAGNOSIS — I10 Essential (primary) hypertension: Secondary | ICD-10-CM | POA: Diagnosis not present

## 2023-01-22 DIAGNOSIS — I2510 Atherosclerotic heart disease of native coronary artery without angina pectoris: Secondary | ICD-10-CM | POA: Diagnosis not present

## 2023-01-22 DIAGNOSIS — R911 Solitary pulmonary nodule: Secondary | ICD-10-CM

## 2023-01-22 DIAGNOSIS — I2583 Coronary atherosclerosis due to lipid rich plaque: Secondary | ICD-10-CM | POA: Diagnosis not present

## 2023-01-22 NOTE — Progress Notes (Signed)
 Established Patient Office Visit   Subjective:  Patient ID: Jeffrey Poole, male    DOB: [birth year]  Age: 73 y.o. MRN: [id number]  Chief Complaint  Patient presents with   OFFICE VISIT     1 month follow up    Hypertension    Hypertension Pertinent negatives include no blurred vision.   Encounter Diagnoses  Name Primary?   Essential hypertension Yes   Coronary artery disease due to lipid rich plaque    Lung nodule seen on imaging study    For follow-up of above.  Blood pressures at home are running in the 120s over 70s to 80s.  He is exercising by walking 2-3 times weekly.  He denies associated chest pain or shortness of breath.  Continues with rosuvastatin  10 mg.   Review of Systems  Constitutional: Negative.   HENT: Negative.    Eyes:  Negative for blurred vision, discharge and redness.  Respiratory: Negative.    Cardiovascular: Negative.   Gastrointestinal:  Negative for abdominal pain.  Genitourinary: Negative.   Musculoskeletal: Negative.  Negative for myalgias.  Skin:  Negative for rash.  Neurological:  Negative for tingling, loss of consciousness and weakness.  Endo/Heme/Allergies:  Negative for polydipsia.     Current Outpatient Medications:    amLODipine  (NORVASC ) 10 MG tablet, Take 1 tablet (10 mg total) by mouth daily., Disp: 90 tablet, Rfl: 0   aspirin  EC 81 MG tablet, Take 1 tablet (81 mg total) by mouth daily., Disp: 90 tablet, Rfl: 3   lansoprazole  (PREVACID ) 30 MG capsule, Take 1 capsule (30 mg total) by mouth daily at 12 noon., Disp: 90 capsule, Rfl: 2   nitroGLYCERIN  (NITROSTAT ) 0.4 MG SL tablet, DISSOLVE ONE TABLET UNDER THE TONGUE EVERY 5 MINUTES AS NEEDED FOR CHEST PAIN.  DO NOT EXCEED A TOTAL OF 3 DOSES IN 15 MINUTES, Disp: 25 tablet, Rfl: 0   rosuvastatin  (CRESTOR ) 10 MG tablet, Take 1 tablet (10 mg total) by mouth 2 (two) times a week., Disp: 30 tablet, Rfl: 3   Objective:     BP 124/73 (BP Location: Right Arm, Patient Position: Sitting, Cuff  Size: Large) Comment: recheck reading  Pulse 64   Temp 98 F (36.7 C) (Temporal)   Resp 18   Wt 247 lb (112 kg)   SpO2 99%   BMI 30.87 kg/m    Physical Exam Constitutional:      General: He is not in acute distress.    Appearance: Normal appearance. He is not ill-appearing, toxic-appearing or diaphoretic.  HENT:     Head: Normocephalic and atraumatic.     Right Ear: External ear normal.     Left Ear: External ear normal.  Eyes:     General: No scleral icterus.       Right eye: No discharge.        Left eye: No discharge.     Extraocular Movements: Extraocular movements intact.     Conjunctiva/sclera: Conjunctivae normal.  Cardiovascular:     Rate and Rhythm: Normal rate and regular rhythm.  Pulmonary:     Effort: Pulmonary effort is normal. No respiratory distress.     Breath sounds: Normal breath sounds. No wheezing or rales.  Abdominal:     General: Bowel sounds are normal.  Musculoskeletal:     Cervical back: No rigidity or tenderness.  Lymphadenopathy:     Cervical: No cervical adenopathy.  Skin:    General: Skin is warm and dry.  Neurological:     Mental  Status: He is alert and oriented to person, place, and time.  Psychiatric:        Mood and Affect: Mood normal.        Behavior: Behavior normal.      No results found for any visits on 01/22/23.    The 10-year ASCVD risk score (Arnett DK, et al., 2019) is: 19.3%    Assessment & Plan:   Essential hypertension  Coronary artery disease due to lipid rich plaque  Lung nodule seen on imaging study    Return in about 6 months (around 07/22/2023).  Continue rosuvastatin  and amlodipine .  Check and record blood pressures on occasion.  Reminded patient about upcoming CT in April for follow-up of lung nodule.  Information was given on managing hypertension.  Elsie Sim Lent, MD

## 2023-02-22 ENCOUNTER — Other Ambulatory Visit: Payer: Self-pay | Admitting: Family Medicine

## 2023-02-22 DIAGNOSIS — I10 Essential (primary) hypertension: Secondary | ICD-10-CM

## 2023-04-04 ENCOUNTER — Other Ambulatory Visit: Payer: Self-pay | Admitting: Cardiology

## 2023-09-10 ENCOUNTER — Other Ambulatory Visit: Payer: Self-pay | Admitting: Family Medicine

## 2023-09-10 DIAGNOSIS — K219 Gastro-esophageal reflux disease without esophagitis: Secondary | ICD-10-CM

## 2023-09-12 ENCOUNTER — Other Ambulatory Visit: Payer: Self-pay | Admitting: Family Medicine

## 2023-09-12 DIAGNOSIS — K219 Gastro-esophageal reflux disease without esophagitis: Secondary | ICD-10-CM

## 2023-10-15 ENCOUNTER — Telehealth: Payer: Self-pay | Admitting: Cardiology

## 2023-10-15 NOTE — Telephone Encounter (Signed)
 Patient is requesting to have his labs done in the office tomorrow so they will be back in time for his appointment on Thursday. Please place the orders so patient can come in tomorrow, 10/16/23.

## 2023-10-16 ENCOUNTER — Other Ambulatory Visit: Payer: Self-pay

## 2023-10-16 DIAGNOSIS — E782 Mixed hyperlipidemia: Secondary | ICD-10-CM

## 2023-10-17 ENCOUNTER — Ambulatory Visit: Payer: Self-pay | Admitting: Cardiology

## 2023-10-17 DIAGNOSIS — I2510 Atherosclerotic heart disease of native coronary artery without angina pectoris: Secondary | ICD-10-CM | POA: Insufficient documentation

## 2023-10-17 DIAGNOSIS — H906 Mixed conductive and sensorineural hearing loss, bilateral: Secondary | ICD-10-CM | POA: Insufficient documentation

## 2023-10-17 DIAGNOSIS — H903 Sensorineural hearing loss, bilateral: Secondary | ICD-10-CM | POA: Insufficient documentation

## 2023-10-17 DIAGNOSIS — E782 Mixed hyperlipidemia: Secondary | ICD-10-CM

## 2023-10-17 LAB — LIPID PANEL
Chol/HDL Ratio: 3.2 ratio (ref 0.0–5.0)
Cholesterol, Total: 143 mg/dL (ref 100–199)
HDL: 45 mg/dL
LDL Chol Calc (NIH): 75 mg/dL (ref 0–99)
Triglycerides: 128 mg/dL (ref 0–149)
VLDL Cholesterol Cal: 23 mg/dL (ref 5–40)

## 2023-10-17 LAB — COMPREHENSIVE METABOLIC PANEL WITH GFR
ALT: 34 IU/L (ref 0–44)
AST: 29 IU/L (ref 0–40)
Albumin: 4.3 g/dL (ref 3.8–4.8)
Alkaline Phosphatase: 107 IU/L (ref 47–123)
BUN/Creatinine Ratio: 11 (ref 10–24)
BUN: 11 mg/dL (ref 8–27)
Bilirubin Total: 0.6 mg/dL (ref 0.0–1.2)
CO2: 21 mmol/L (ref 20–29)
Calcium: 9.6 mg/dL (ref 8.6–10.2)
Chloride: 103 mmol/L (ref 96–106)
Creatinine, Ser: 1 mg/dL (ref 0.76–1.27)
Globulin, Total: 3.1 g/dL (ref 1.5–4.5)
Glucose: 110 mg/dL — ABNORMAL HIGH (ref 70–99)
Potassium: 4.6 mmol/L (ref 3.5–5.2)
Sodium: 139 mmol/L (ref 134–144)
Total Protein: 7.4 g/dL (ref 6.0–8.5)
eGFR: 79 mL/min/1.73

## 2023-10-18 ENCOUNTER — Ambulatory Visit: Attending: Cardiology | Admitting: Cardiology

## 2023-10-18 ENCOUNTER — Encounter: Payer: Self-pay | Admitting: Cardiology

## 2023-10-18 VITALS — BP 126/78 | HR 58 | Ht 75.0 in | Wt 247.2 lb

## 2023-10-18 DIAGNOSIS — I10 Essential (primary) hypertension: Secondary | ICD-10-CM

## 2023-10-18 DIAGNOSIS — I7789 Other specified disorders of arteries and arterioles: Secondary | ICD-10-CM | POA: Diagnosis not present

## 2023-10-18 DIAGNOSIS — E782 Mixed hyperlipidemia: Secondary | ICD-10-CM

## 2023-10-18 DIAGNOSIS — I25118 Atherosclerotic heart disease of native coronary artery with other forms of angina pectoris: Secondary | ICD-10-CM | POA: Diagnosis not present

## 2023-10-18 MED ORDER — METOPROLOL TARTRATE 25 MG PO TABS: 25.0000 mg | ORAL_TABLET | Freq: Once | ORAL | 0 refills | Status: DC

## 2023-10-18 MED ORDER — ROSUVASTATIN CALCIUM 20 MG PO TABS: 10.0000 mg | ORAL_TABLET | ORAL | 3 refills | Status: AC

## 2023-10-18 NOTE — Telephone Encounter (Signed)
Viewed in MyChart Routed to PCP  

## 2023-10-18 NOTE — Progress Notes (Signed)
 Cardiology Office Note:    Date:  10/18/2023   ID:  Jeffrey Poole, DOB Dec 25, 1950, MRN [id number]  PCP:  Berneta Elsie Sayre, MD  Cardiologist:  Redell Leiter, MD    Referring MD: Berneta Elsie Sayre,*    ASSESSMENT:    1. Coronary artery disease of native artery of native heart with stable angina pectoris   2. Mixed hyperlipidemia   3. Essential hypertension   4. Enlarged thoracic aorta    PLAN:    In order of problems listed above:  He had severe LAD stenosis in 2020.  We discussed previously repeating a CTA and we will plan on doing that to understand where he is at the disease process and whether his had significant other progression. Continue treatment including aspirin  his high intensity statin and antihypertensive CT in 2021 showed no aortic abnormality will be able to reassess his aorta on the cardiac CTA   Next appointment: 1 year   Medication Adjustments/Labs and Tests Ordered: Current medicines are reviewed at length with the patient today.  Concerns regarding medicines are outlined above.  Orders Placed This Encounter  Procedures   EKG 12-Lead   No orders of the defined types were placed in this encounter.    History of Present Illness:    Jeffrey Poole is a 73 y.o. male with a hx of coronary artery disease with severe obstructive LAD disease on previous cardiac CTA with the patient declining coronary angiography and intervention and opting for medical treatment hypertension hyperlipidemia and mild enlargement of the ascending aorta last seen 10/17/2022.  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   lansoprazole  (PREVACID ) 30 MG capsule TAKE 1 CAPSULE BY MOUTH ONCE DAILY AT  12  NOON   nitroGLYCERIN  (NITROSTAT ) 0.4 MG SL tablet DISSOLVE ONE TABLET UNDER THE TONGUE EVERY 5 MINUTES AS NEEDED FOR CHEST PAIN.  DO NOT EXCEED A TOTAL OF 3 DOSES IN 15 MINUTES   rosuvastatin  (CRESTOR ) 10 MG tablet Take 1 tablet (10 mg total) by mouth 2 (two) times a week.      EKGs/Labs/Other Studies Reviewed:    The following studies were reviewed today:  Cardiac Studies & Procedures   ______________________________________________________________________________________________     ECHOCARDIOGRAM  ECHOCARDIOGRAM COMPLETE 07/03/2018  Narrative ECHOCARDIOGRAM REPORT    Patient Name:   Jeffrey Poole Date of Exam: 07/03/2018 Medical Rec #:  [id number]   Height:       75.0 in Accession #:    [id number]  Weight:       251.8 lb Date of Birth:  [birth year]   BSA:          2.42 m Patient Age:    68 years    BP:           138/84 mmHg Patient Gender: M           HR:           55 bpm. Exam Location:  [place]   Procedure: 2D Echo  Indications:    Dyspnea  History:        Patient has no prior history of Echocardiogram examinations. Palpitation Risk Factors: Hypertension.  Sonographer:    Fairy Canton RDCS (AE) Referring Phys: [id number] Bard Haupert J  Merek Niu  IMPRESSIONS   1. The left ventricle has normal systolic function, with an ejection fraction of 55-60%. The cavity size was normal. There is mild concentric left ventricular hypertrophy. Left ventricular diastolic Doppler parameters are consistent with impaired relaxation. 2. The right ventricle has normal systolic function. The cavity was normal. There is no increase in right ventricular wall thickness. 3. The aortic root and aortic arch are normal in size and structure. 4. There is mild to moderate dilatation of the ascending aorta measuring 40 mm.  FINDINGS Left Ventricle: The left ventricle has normal systolic function, with an ejection fraction of 55-60%. The cavity size was normal. There is mild concentric left ventricular hypertrophy. Left ventricular diastolic Doppler parameters are consistent with impaired relaxation. Normal left ventricular filling pressures  LV Wall Scoring: All segments are normal.   Right Ventricle: The right ventricle has normal systolic function. The cavity was normal. There is no increase in right ventricular wall thickness.  Left Atrium: Left atrial size was normal in size.  Right Atrium: Right atrial size was normal in size. Right atrial pressure is estimated at 3 mmHg.  Interatrial Septum: No atrial level shunt detected by color flow Doppler.  Pericardium: There is no evidence of pericardial effusion.  Mitral Valve: The mitral valve is normal in structure. Mitral valve regurgitation is mild by color flow Doppler. No evidence of mitral valve stenosis.  Tricuspid Valve: The tricuspid valve is normal in structure. Tricuspid valve regurgitation was not visualized by color flow Doppler.  Aortic Valve: The aortic valve is tricuspid Aortic valve regurgitation was not visualized by color flow Doppler. There is No stenosis of the aortic valve.  Pulmonic Valve: The pulmonic valve was grossly normal. Pulmonic valve regurgitation is not visualized by  color flow Doppler. No evidence of pulmonic stenosis.  Aorta: The aortic root and aortic arch are normal in size and structure. There is mild to moderate dilatation of the ascending aorta measuring 40 mm.  Venous: The inferior vena cava measures 1.74 cm, is normal in size with greater than 50% respiratory variability.   +--------------+--------++ LEFT VENTRICLE         +----------------+---------++ +--------------+--------++ Diastology                PLAX 2D                +----------------+---------++ +--------------+--------++ LV e' lateral:  9.38 cm/s LVIDd:        5.38 cm  +----------------+---------++ +--------------+--------++ LV E/e' lateral:4.2       LVIDs:        3.49 cm  +----------------+---------++ +--------------+--------++ LV e' medial:   6.45 cm/s LV PW:        1.27 cm  +----------------+---------++ +--------------+--------++ LV E/e' medial:  6.1       LV IVS:       1.20 cm  +----------------+---------++ +--------------+--------++ LVOT diam:    2.10 cm  +--------------+--------++ LV SV:        90 ml    +--------------+--------++ LV SV Index:  36.05    +--------------+--------++ LVOT Area:    3.46 cm +--------------+--------++                        +--------------+--------++  +---------------+----------++ RIGHT VENTRICLE           +---------------+----------++ RV S prime:    13.20 cm/s +---------------+----------++ TAPSE (M-mode):2.4 cm     +---------------+----------++  +-------------+-------++-----------++ LEFT ATRIUM         Index       +-------------+-------++-----------++ LA diam:     3.80 cm1.57 cm/m  +-------------+-------++-----------++ LA Vol (A2C):64.2 ml26.53 ml/m +-------------+-------++-----------++ +------------+---------++-----------++ RIGHT ATRIUM         Index       +------------+---------++-----------++ RA Area:    18.30 cm             +------------+---------++-----------++ RA Volume:  50.00 ml 20.66 ml/m +------------+---------++-----------++ +------------+-----------++ AORTIC VALVE            +------------+-----------++ LVOT Vmax:  69.60 cm/s  +------------+-----------++ LVOT Vmean: 47.900 cm/s +------------+-----------++ LVOT VTI:   0.155 m     +------------+-----------++  +-------------+-------++ AORTA                +-------------+-------++ Ao Root diam:3.50 cm +-------------+-------++ Ao Asc diam: 4.10 cm +-------------+-------++  +--------------+----------++ MITRAL VALVE             +--------------+-------+ +--------------+----------++ SHUNTS                MV Area (PHT):2.32 cm   +--------------+-------+ +--------------+----------++ Systemic VTI: 0.16 m  MV PHT:       94.83 msec +--------------+-------+ +--------------+----------++ Systemic Diam:2.10 cm MV Decel Time:327 msec   +--------------+-------+ +--------------+----------++ +--------------+----------++ MV E velocity:39.60 cm/s +--------------+----------++ MV A velocity:44.20 cm/s +--------------+----------++ MV E/A ratio: 0.90       +--------------+----------++  +---------+-------+ IVC              +---------+-------+ IVC diam:1.74 cm +---------+-------+   Redell Leiter MD Electronically signed by Redell Leiter MD Signature Date/Time: 07/03/2018/11:47:41 AM    Final      CT SCANS  CT CORONARY MORPH W/CTA COR W/SCORE 08/07/2018  Addendum 08/08/2018  7:00 PM ADDENDUM REPORT: 08/08/2018 18:57  CLINICAL DATA:  73 yo male with history of hypertension, now with chest pain  EXAM: Cardiac/Coronary CT  TECHNIQUE: The patient was scanned on a [employer].  FINDINGS: A 120 kV prospective scan was triggered in the descending thoracic aorta at 111 HU's. Axial non-contrast 3 mm slices were carried out through the heart. The data set  was analyzed on a dedicated work station and scored using the Agatson method. Gantry rotation speed was 250 msecs and collimation was .6 mm. No beta blockade and 0.8 mg of sl NTG was given. The 3D data set was reconstructed in 5% intervals of the 67-82 % of the R-R cycle. Diastolic phases were analyzed on a dedicated work station using MPR, MIP and VRT modes. The patient received 80 cc of contrast.  Aorta: Borderline dilated at 3.9 cm. No calcifications. No dissection.  Aortic Valve:  Trileaflet.  No calcifications.  Coronary Arteries:  Normal coronary origin.  Right dominance.  RCA is a large dominant artery that gives  rise to PDA and PLVB. There is focal calcification and a long-segment of non-calcified plaque with 25-49% stenosis (CADRADS2), followed by a more significant calcified 50-69% stenosis (CADRADS3) in the proximal to mid segment.  Left main is a large artery that gives rise to LAD and LCX arteries.  LAD is a large vessel that is calcified proximally with likely severe stenosis 70-99% just proximal to the takeoff of a moderate-sized diagonal vessel. There is also a calcified plaque at the bifurcation with D1 with moderate stenosis 50-69% (CADRADS3). There is no significant stenosis of the diagonal.  LCX is a non-dominant artery which is tortuous and gives rise to small OM branches. There is no significant obstructive disease.  Other findings:  Normal pulmonary vein drainage into the left atrium.  Normal left atrial appendage without a thrombus.  Normal size of the pulmonary artery.  IMPRESSION: 1. Coronary calcium  score of 167. This was 56th percentile for age and sex matched control. Calcium  is seen in the LAD and RCA.  2.  Right dominant circulation.  3. Severe calcified stenosis of the proximal to mid LAD (CADRADS 4) just proximal to the takeoff of the D1 vessel.  4. Sequential mild to moderate calcified, non-obstructive stenoses of the proximal to mid  RCA (CADRADS3).  5.  Borderline dilated aortic root at 3.9 cm.  5.  Recommend cardiac catheterization.  Vinie Maxcy   Electronically Signed By: Vinie JAYSON Maxcy M.D. On: 08/08/2018 18:57  Narrative EXAM: OVER-READ INTERPRETATION  CT CHEST  The following report is an over-read performed by radiologist Dr. Franky Crease of [facility] on 08/07/2018. This over-read does not include interpretation of cardiac or coronary anatomy or pathology. The coronary CTA interpretation by the cardiologist is attached.  COMPARISON:  07/24/2018  FINDINGS: Vascular: Heart is borderline enlarged. Visualized aorta normal caliber.  Mediastinum/Nodes: No adenopathy in the lower mediastinum or hila.  Lungs/Pleura: Visualized lungs clear.  No effusions.  Upper Abdomen: Imaging into the upper abdomen shows no acute findings.  Musculoskeletal: Chest wall soft tissues are unremarkable. No acute bony abnormality.  IMPRESSION: No acute or significant extracardiac abnormality.  Electronically Signed: By: Franky Crease M.D. On: 08/07/2018 16:22     ______________________________________________________________________________________________          Recent Labs: 10/16/2023: ALT 34; BUN 11; Creatinine, Ser 1.00; Potassium 4.6; Sodium 139  Recent Lipid Panel    Component Value Date/Time   CHOL 143 10/16/2023 1042   TRIG 128 10/16/2023 1042   HDL 45 10/16/2023 1042   CHOLHDL 3.2 10/16/2023 1042   CHOLHDL 3.7 09/23/2021 1622   VLDL 26.0 05/07/2017 1608   LDLCALC 75 10/16/2023 1042   LDLCALC 106 (H) 09/23/2021 1622   LDLDIRECT 60 04/29/2020 1449    Physical Exam:    VS:  BP 126/78   Pulse (!) 58   Ht 6' 3 (1.905 m)   Wt 247 lb 3.2 oz (112.1 kg)   SpO2 96%   BMI 30.90 kg/m     Wt Readings from Last 3 Encounters:  10/18/23 247 lb 3.2 oz (112.1 kg)  01/22/23 247 lb (112 kg)  11/27/22 244 lb 9.6 oz (110.9 kg)     GEN:  Well nourished, well developed in no acute  distress HEENT: Normal NECK: No JVD; No carotid bruits LYMPHATICS: No lymphadenopathy CARDIAC: RRR, no murmurs, rubs, gallops RESPIRATORY:  Clear to auscultation without rales, wheezing or rhonchi  ABDOMEN: Soft, non-tender, non-distended MUSCULOSKELETAL:  No edema; No deformity  SKIN: Warm and dry NEUROLOGIC:  Alert and oriented x 3 PSYCHIATRIC:  Normal affect    Signed, Redell Leiter, MD  10/18/2023 3:29 PM    [facility]

## 2023-10-18 NOTE — Patient Instructions (Signed)
 Medication Instructions:  Your physician recommends that you continue on your current medications as directed. Please refer to the Current Medication list given to you today.  *If you need a refill on your cardiac medications before your next appointment, please call your pharmacy*  Lab Work: Your physician recommends that you return for lab work in:   Labs today: BMP  If you have labs (blood work) drawn today and your tests are completely normal, you will receive your results only by: MyChart Message (if you have MyChart) OR A paper copy in the mail If you have any lab test that is abnormal or we need to change your treatment, we will call you to review the results.  Testing/Procedures:   Your cardiac CT will be scheduled at one of the below locations:   [facility] [address] [place], [place] [address] [phone] (Severe contrast allergies only)  OR   [facility] [address] [place], [place] [address] [phone]  OR   [facility] [address] [place], [place] [address] [phone]  OR   [facility] and Vascular Tower [address]  [place], [place] [address] [phone]  OR   [facility] [address] [place], [place] [phone]  If scheduled at [facility], please arrive at the [facility] and Children's Entrance (Entrance C2) of [facility] 30 minutes prior to test start time. You can use the FREE valet parking offered at entrance C (encouraged to control the heart rate for the test)  Proceed to the [facility] Radiology Department (first floor) to check-in and test prep.  All radiology patients and guests should use entrance C2 at [facility], accessed from [facility], even though the hospital's physical address listed is [address].  If scheduled at the Heart and Vascular Tower at [address], please enter the  parking lot using the [address] entrance and use the FREE valet service at the patient drop-off area. Enter the building and check-in with registration on the main floor.  If scheduled at [facility], please arrive to the Heart and Vascular Center 15 mins early for check-in and test prep.  There is spacious parking and easy access to the radiology department from the [facility] Heart and Vascular entrance. Please enter here and check-in with the desk attendant.   If scheduled at [facility], please arrive 30 minutes early for check-in and test prep.  Please follow these instructions carefully (unless otherwise directed):  An IV will be required for this test and Nitroglycerin  will be given.  Hold all erectile dysfunction medications at least 3 days (72 hrs) prior to test. (Ie viagra, cialis, sildenafil, tadalafil, etc)   On the Night Before the Test: Be sure to Drink plenty of water. Do not consume any caffeinated/decaffeinated beverages or chocolate 12 hours prior to your test. Do not take any antihistamines 12 hours prior to your test.  On the Day of the Test: Drink plenty of water until 1 hour prior to the test. Do not eat any food 1 hour prior to test. You may take your regular medications prior to the test.  Take metoprolol  (Lopressor ) two hours prior to test. Patients who wear a continuous glucose monitor MUST remove the device prior to scanning.      After the Test: Drink plenty of water. After receiving IV contrast, you may experience a mild flushed feeling. This  is normal. On occasion, you may experience a mild rash up to 24 hours after the test. This is not dangerous. If this occurs, you can take Benadryl 25 mg, Zyrtec, Claritin, or Allegra and increase your fluid intake. (Patients taking Tikosyn should avoid Benadryl, and may take Zyrtec, Claritin, or Allegra) If you experience trouble breathing, this can be serious. If it is severe call 911  IMMEDIATELY. If it is mild, please call our office.  We will call to schedule your test 2-4 weeks out understanding that some insurance companies will need an authorization prior to the service being performed.   For more information and frequently asked questions, please visit our website : [email or web address]  For non-scheduling related questions, please contact the cardiac imaging nurse navigator should you have any questions/concerns: Cardiac Imaging Nurse Navigators Direct Office Dial: [phone]   For scheduling needs, including cancellations and rescheduling, please call [place], [phone].   Follow-Up: At [facility], you and your health needs are our priority.  As part of our continuing mission to provide you with exceptional heart care, our providers are all part of one team.  This team includes your primary Cardiologist (physician) and Advanced Practice Providers or APPs (Physician Assistants and Nurse Practitioners) who all work together to provide you with the care you need, when you need it.  Your next appointment:   1 year(s)  Provider:   Redell Leiter, MD    We recommend signing up for the patient portal called MyChart.  Sign up information is provided on this After Visit Summary.  MyChart is used to connect with patients for Virtual Visits (Telemedicine).  Patients are able to view lab/test results, encounter notes, upcoming appointments, etc.  Non-urgent messages can be sent to your provider as well.   To learn more about what you can do with MyChart, go to [email or web address].   Other Instructions None

## 2023-11-06 ENCOUNTER — Telehealth (HOSPITAL_COMMUNITY): Payer: Self-pay | Admitting: Emergency Medicine

## 2023-11-06 NOTE — Telephone Encounter (Signed)
 Pt r/s CCTA due to cold New appt 11/13 at [facility]  Camie Shutter RN Navigator Cardiac Imaging [facility] [phone] Office  [phone] Cell

## 2023-11-08 ENCOUNTER — Other Ambulatory Visit (HOSPITAL_BASED_OUTPATIENT_CLINIC_OR_DEPARTMENT_OTHER): Admitting: Radiology

## 2023-11-28 ENCOUNTER — Telehealth (HOSPITAL_COMMUNITY): Payer: Self-pay | Admitting: *Deleted

## 2023-11-28 NOTE — Telephone Encounter (Signed)
 Reaching out to patient to offer assistance regarding upcoming cardiac imaging study; pt request to cancel study at this time and will call back to reschedule.  Sid Seats RN Navigator Cardiac Imaging [facility] [phone] office [phone] cell

## 2023-11-29 ENCOUNTER — Inpatient Hospital Stay (HOSPITAL_BASED_OUTPATIENT_CLINIC_OR_DEPARTMENT_OTHER): Admission: RE | Admit: 2023-11-29 | Source: Ambulatory Visit | Admitting: Radiology

## 2023-12-24 ENCOUNTER — Encounter: Payer: Self-pay | Admitting: Family Medicine

## 2024-01-11 LAB — FECAL OCCULT BLOOD, IMMUNOCHEMICAL: IFOBT: NEGATIVE

## 2024-01-29 ENCOUNTER — Ambulatory Visit: Payer: Self-pay | Admitting: *Deleted

## 2024-01-29 NOTE — Telephone Encounter (Signed)
 I called and spoke with patient and he had no concerns about medications and will see Dr. Berneta tomorrow at 2:20pm.

## 2024-01-29 NOTE — Telephone Encounter (Signed)
 Please advise. See NT encounter . Appt scheduled with other provider for tomorrow.   FYI Only or Action Required?: Action required by provider: clinical question for provider and possible side effects from amlodipine .  Patient was last seen in primary care on 01/22/2023 by Jeffrey Elsie Sayre, MD.  Called Nurse Triage reporting Medication Problem.  Symptoms began several months ago.  Interventions attempted: Other: stops medication for a while and then tries again.  Symptoms are: unchanged.  Triage Disposition: Call PCP Now  Patient/caregiver understands and will follow disposition?: Yes                 Copied from CRM #[id number]. Topic: Clinical - Red Word Triage >> Jan 29, 2024 12:12 PM [name] wrote: Red Word that prompted transfer to Nurse Triage: bp medication is causing nausea, itching, and muscle cramps, says its the worst thing ever. Unsure if possible reaction. Reason for Disposition  [1] Caller has URGENT medicine question about med that primary care doctor (or NP/PA) or specialist prescribed AND [2] triager unable to answer question  Answer Assessment - Initial Assessment Questions Appt scheduled for tomorrow with other provider due to elevated BP and possible side effects of amlodipine  . No available appt until 02/07/24 with PCP. Please advise if appt needed or if patient can be prescribed different medication for BP management. Patient reports he has been taking amlodipine  on and off since 02/23/23 due to causing side effects. Muscle cramps are worst sx and noted within few hours after taking amlodipine , cramps worse with sitting and involve feet and calves. Denies swelling no redness in legs. No report of headaches no dizziness no vomiting. Patient has not taken medication in couple of days . Recommended patient check BP and monitor and call back if elevated. Please advise. Recommended if sx worsen , chest pain or difficulty breathing occur go to ED  .       1. NAME of MEDICINE: What medicine(s) are you calling about?     Amlodipine   2. QUESTION: What is your question? (e.g., double dose of medicine, side effect)    Do you think amlodipine  is causing sx / side effects after taking medication? 3. PRESCRIBER: Who prescribed the medicine? Reason: if prescribed by specialist, call should be referred to that group.     PCP 4. SYMPTOMS: Do you have any symptoms? If Yes, ask: What symptoms are you having?  How bad are the symptoms (e.g., mild, moderate, severe)     BP running 150's/80's. After taking amlodipine  patient experiences nausea, itching and muscle cramps. When patient stops taking medication he does not experience sx but BP elevates.  5. PREGNANCY:  Is there any chance that you are pregnant? When was your last menstrual period?     na  Protocols used: Medication Question Call-A-AH

## 2024-01-30 ENCOUNTER — Ambulatory Visit: Admitting: Family Medicine

## 2024-01-30 VITALS — BP 138/75 | HR 81 | Temp 98.0°F | Ht 75.0 in | Wt 246.0 lb

## 2024-01-30 DIAGNOSIS — I2510 Atherosclerotic heart disease of native coronary artery without angina pectoris: Secondary | ICD-10-CM | POA: Diagnosis not present

## 2024-01-30 DIAGNOSIS — I10 Essential (primary) hypertension: Secondary | ICD-10-CM

## 2024-01-30 DIAGNOSIS — R0602 Shortness of breath: Secondary | ICD-10-CM | POA: Insufficient documentation

## 2024-01-30 DIAGNOSIS — K219 Gastro-esophageal reflux disease without esophagitis: Secondary | ICD-10-CM

## 2024-01-30 DIAGNOSIS — E782 Mixed hyperlipidemia: Secondary | ICD-10-CM | POA: Diagnosis not present

## 2024-01-30 DIAGNOSIS — I2583 Coronary atherosclerosis due to lipid rich plaque: Secondary | ICD-10-CM

## 2024-01-30 MED ORDER — LANSOPRAZOLE 30 MG PO CPDR: 30.0000 mg | DELAYED_RELEASE_CAPSULE | Freq: Every day | ORAL | 3 refills | Status: AC

## 2024-01-30 MED ORDER — VALSARTAN-HYDROCHLOROTHIAZIDE 80-12.5 MG PO TABS: 1.0000 | ORAL_TABLET | Freq: Every day | ORAL | 3 refills | Status: AC

## 2024-01-30 MED ORDER — LANSOPRAZOLE 30 MG PO CPDR: 30.0000 mg | DELAYED_RELEASE_CAPSULE | Freq: Every day | ORAL | 0 refills | Status: DC

## 2024-01-30 NOTE — Progress Notes (Signed)
 " Assessment & Plan   Assessment/Plan:    Assessment and Plan Assessment & Plan Coronary artery disease Severe blockage in the LAD and descending artery. No current chest pain or significant symptoms during physical activity. Recent increase in shortness of breath, possibly related to coronary artery disease. Declined stent placement due to personal beliefs and lack of significant symptoms. Concerns about radiation exposure from CT scans and declined further imaging. - Encouraged follow-up with cardiologist. - Ordered baseline EKG. - Ordered BNP to assess for heart failure. - Ordered CBC to rule out anemia. - Ordered TSH reflex on abnormal to free T4 to assess thyroid  function. - Ordered CMP to evaluate renal function and electrolytes. - Ordered urinalysis with microscopic reflex culture and microbium creatinine ratio to assess for proteinuria. - Advised to go to the emergency department if shortness of breath or chest pain worsens.  Primary hypertension Hypertension with recent home blood pressure readings ranging from 150s to 180s. Current office reading is 138/75. Intolerance to amlodipine  due to leg cramps and nausea. Plan to start a new antihypertensive regimen with valsartan  and hydrochlorothiazide  to minimize side effects and gradually adjust dosage. - Discontinued amlodipine  due to leg cramps and nausea. - Started valsartan -hydrochlorothiazide  80-12.5 mg daily. - Recommended bringing home blood pressure cuff to office for comparison. - Scheduled follow-up in one month for blood pressure recheck. - Ordered fasting lipid panel in two weeks. - Ordered CMP in two weeks to evaluate renal function and electrolytes.  Mixed hyperlipidemia Managed with rosuvastatin . - Continue rosuvastatin  10 mg three times a week. - Ordered fasting lipid panel in two weeks.  Shortness of breath Recent onset of shortness of breath, possibly related to coronary artery disease. No associated chest pain,  cough, or wheezing. Differential includes cardiac causes and anemia. - Ordered BNP to assess for heart failure. - Ordered CBC to rule out anemia. - Ordered TSH reflex on abnormal to free T4 to assess thyroid  function.  Gastroesophageal reflux disease Long-standing GERD managed with lansoprazole . - Continue lansoprazole  30 mg daily. - Refilled lansoprazole  prescription.        Medications Discontinued During This Encounter  Medication Reason   metoprolol  tartrate (LOPRESSOR ) 25 MG tablet    lansoprazole  (PREVACID ) 30 MG capsule Reorder   amLODipine  (NORVASC ) 10 MG tablet    lansoprazole  (PREVACID ) 30 MG capsule     Return for fasting labs in two weeks with lab visit, follow up in 1 month for BP check .        Subjective:   Encounter date: 01/30/2024  Jeffrey Poole is a 74 y.o. 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   He  has a past medical history of Androgen deficiency (05/07/2017), Coronary artery disease, Encounter for health maintenance examination with abnormal findings (05/07/2017), Fibromyalgia (06/24/2018), Gastroesophageal reflux disease (05/07/2017), Hypertension (06/24/2018), Low testosterone  (06/24/2018), Palpitations (06/25/2018), Retinal detachment of right eye with  multiple breaks (02/08/2018), and Trigger finger of all digits of both hands (05/07/2017).[name]  He presents with chief complaint of Acute Visit (Pt presents today for med questions for the amlodipine  and possible side effects and a elevated BP. ) .   Discussed the use of AI scribe software for clinical note transcription with the patient, who gave verbal consent to proceed.  History of Present Illness Jeffrey Poole is a 74 year old male with hypertension and coronary artery disease who presents with concerns about side effects from amlodipine .  Antihypertensive medication side effects - Leg cramps and nausea associated with amlodipine  use - Discontinuation of amlodipine  resolved leg cramps - Lower extremity swelling while on amlodipine   Blood pressure monitoring and control - Home blood pressure monitoring with Garmin device and conventional cuff - Readings vary from the 150s to lower values - Intermittent blood pressure spikes that return to normal - Minimal salt and caffeine intake  Dyspnea and physical activity - Mild shortness of breath, more noticeable over the past six months - Attributed to decreased physical activity over the past year - History of jogging for over fifty years, with recent reduction in activity - Recent weight gain  Coronary artery disease - History of blockage in the left anterior descending artery - No chest pain - No use of nitroglycerin  - Last cardiology visit in October - No significant changes in cardiac symptoms since last visit  Gastroesophageal reflux disease - Lansoprazole  30 mg daily for forty years  Hyperlipidemia and antiplatelet therapy - Rosuvastatin  10 mg three times a week - Aspirin  81 mg daily for antiplatelet therapy     ROS  Past Surgical History:  Procedure Laterality Date   BACK SURGERY     RETINAL DETACHMENT SURGERY      Outpatient Medications Prior to Visit  Medication Sig Dispense Refill   aspirin  EC 81 MG tablet Take  1 tablet (81 mg total) by mouth daily. 90 tablet 3   rosuvastatin  (CRESTOR ) 20 MG tablet Take 0.5 tablets (10 mg total) by mouth 3 (three) times a week. 27 tablet 3   lansoprazole  (PREVACID ) 30 MG capsule TAKE 1 CAPSULE BY MOUTH ONCE DAILY AT  12  NOON 30 capsule 0   nitroGLYCERIN  (NITROSTAT ) 0.4 MG SL tablet DISSOLVE ONE TABLET UNDER THE TONGUE EVERY 5 MINUTES AS NEEDED FOR CHEST PAIN.  DO NOT EXCEED A TOTAL OF 3 DOSES IN 15 MINUTES (Patient not taking: Reported on 01/30/2024) 25 tablet 0   amLODipine  (NORVASC ) 10 MG tablet Take 1 tablet by mouth once daily (Patient not taking: Reported on 01/30/2024) 90 tablet 0   metoprolol  tartrate (LOPRESSOR ) 25 MG tablet Take 1 tablet (25 mg total) by mouth once for 1 dose. Please take this medication 2 hours before CT (Patient not taking: Reported on 01/30/2024) 1 tablet 0   No facility-administered medications prior to visit.    Family History  Problem Relation Age of Onset   Hypertension Mother    Hypertension Brother    Heart Problems Maternal Uncle     Social History   Socioeconomic History   Marital status: Married    Spouse name: Not on file   Number of children: Not on file   Years of education: Not on file   Highest education level: Not on file  Occupational History   Not on file  Tobacco Use   Smoking status: Never   Smokeless tobacco: Never  Vaping Use   Vaping status: Never Used  Substance and Sexual Activity   Alcohol use: Never   Drug use: Never   Sexual  activity: Not on file  Other Topics Concern   Not on file  Social History Narrative   Not on file   Social Drivers of Health   Tobacco Use: Low Risk (10/18/2023)   Patient History    Smoking Tobacco Use: Never    Smokeless Tobacco Use: Never    Passive Exposure: Not on file  Financial Resource Strain: Patient Declined (01/22/2023)   Overall Financial Resource Strain (CARDIA)    Difficulty of Paying Living Expenses: Patient declined  Food Insecurity: Patient Declined  (01/22/2023)   Hunger Vital Sign    Worried About Running Out of Food in the Last Year: Patient declined    Ran Out of Food in the Last Year: Patient declined  Transportation Needs: Patient Declined (01/22/2023)   PRAPARE - Administrator, Civil Service (Medical): Patient declined    Lack of Transportation (Non-Medical): Patient declined  Physical Activity: Insufficiently Active (01/22/2023)   Exercise Vital Sign    Days of Exercise per Week: 3 days    Minutes of Exercise per Session: 30 min  Stress: Patient Declined (01/22/2023)   [employer] of Occupational Health - Occupational Stress Questionnaire    Feeling of Stress : Patient declined  Social Connections: Unknown (01/22/2023)   Social Connection and Isolation Panel    Frequency of Communication with Friends and Family: Patient declined    Frequency of Social Gatherings with Friends and Family: Patient declined    Attends Religious Services: Patient declined    Active Member of Clubs or Organizations: Patient declined    Attends Banker Meetings: Not on file    Marital Status: Patient declined  Intimate Partner Violence: Not At Risk (05/04/2021)   Humiliation, Afraid, Rape, and Kick questionnaire    Fear of Current or Ex-Partner: No    Emotionally Abused: No    Physically Abused: No    Sexually Abused: No  Depression (PHQ2-9): Low Risk (01/30/2024)   Depression (PHQ2-9)    PHQ-2 Score: 0  Alcohol Screen: Low Risk (05/04/2021)   Alcohol Screen    Last Alcohol Screening Score (AUDIT): 0  Housing: Patient Declined (01/22/2023)   Housing Stability Vital Sign    Unable to Pay for Housing in the Last Year: Patient declined    Number of Times Moved in the Last Year: Not on file    Homeless in the Last Year: Patient declined  Utilities: Patient Declined (05/19/2022)   [facility] Utilities    Threatened with loss of utilities: Patient declined  Health Literacy: Not on file                                                                                                   Objective:  Physical Exam: BP 138/75   Pulse 81   Temp 98 F (36.7 C)   Ht 6' 3 (1.905 m)   Wt 246 lb (111.6 kg)   SpO2 96%   BMI 30.75 kg/m    Physical Exam VITALS: BP- 138/75 GENERAL: Alert, cooperative, well developed, no acute distress HEENT: Normocephalic, normal oropharynx, moist mucous membranes CHEST: Clear to  auscultation bilaterally, no wheezes, rhonchi, or crackles CARDIOVASCULAR: Normal heart rate and rhythm, S1 and S2 normal without murmurs ABDOMEN: Soft, non-tender, non-distended, without organomegaly, normal bowel sounds EXTREMITIES: No cyanosis or edema NEUROLOGICAL: Cranial nerves grossly intact, moves all extremities without gross motor or sensory deficit  Results Labs CBC (09/2023): Within normal limits Electrolytes (09/2023): Within normal limits eGFR (09/2023): 79  Electrocardiogram (EKG) Sinus rhythm, occasional premature atrial contractions, within normal limits.   Physical Exam  No results found.  Recent Results (from the past 2160 hours)  Fecal occult blood, imunochemical     Status: None   Collection Time: 01/11/24 12:00 AM  Result Value Ref Range   IFOBT Negative         Beverley Adine Hummer, MD, MS "

## 2024-01-30 NOTE — Patient Instructions (Addendum)
 It was very nice to see you today!  VISIT SUMMARY: During your visit, we discussed your concerns about the side effects of amlodipine , your blood pressure control, shortness of breath, and your coronary artery disease. We have made adjustments to your medications and ordered several tests to monitor your condition.  YOUR PLAN: CORONARY ARTERY DISEASE: You have a severe blockage in your coronary artery but no current chest pain or significant symptoms during physical activity. There is a recent increase in shortness of breath. -Follow up with your cardiologist. -We have ordered a baseline EKG, BNP, CBC, TSH reflex on abnormal to free T4, CMP, and urinalysis with microscopic reflex culture and microbium creatinine ratio. -Go to the emergency department if your shortness of breath or chest pain worsens.  PRIMARY HYPERTENSION: Your blood pressure readings at home have been high, and you have experienced side effects from amlodipine . -We have discontinued amlodipine  and started you on valsartan -hydrochlorothiazide  80-12.5 mg daily. -Bring your home blood pressure cuff to the office for comparison. -Follow up in one month for a blood pressure recheck. -We have ordered a fasting lipid panel and CMP in two weeks.  MIXED HYPERLIPIDEMIA: You are managing your cholesterol levels with rosuvastatin . -Continue taking rosuvastatin  10 mg three times a week. -We have ordered a fasting lipid panel in two weeks.  SHORTNESS OF BREATH: You have recently experienced shortness of breath, which may be related to your coronary artery disease. -We have ordered BNP, CBC, and TSH reflex on abnormal to free T4 to assess for heart failure, anemia, and thyroid  function.  GASTROESOPHAGEAL REFLUX DISEASE: You have long-standing GERD managed with lansoprazole . -Continue taking lansoprazole  30 mg daily. -We have refilled your lansoprazole  prescription.  Return for fasting labs in two weeks with lab visit, follow up in 1  month for BP check .   Take care, Arvella Hummer, MD, MS   PLEASE NOTE:  If you had any lab tests, please let us  know if you have not heard back within a few days. You may see your results on mychart before we have a chance to review them but we will give you a call once they are reviewed by us .   If we ordered any referrals today, please let us  know if you have not heard from their office within the next week.   If you had any urgent prescriptions sent in today, please check with the pharmacy within an hour of our visit to make sure the prescription was transmitted appropriately.   Please try these tips to maintain a healthy lifestyle:  Eat at least 3 REAL meals and 1-2 snacks per day.  Aim for no more than 5 hours between eating.  If you eat breakfast, please do so within one hour of getting up.   Each meal should contain half fruits/vegetables, one quarter protein, and one quarter carbs (no bigger than a computer mouse)  Cut down on sweet beverages. This includes juice, soda, and sweet tea.   Drink at least 1 glass of water with each meal and aim for at least 8 glasses per day  Exercise at least 150 minutes every week.

## 2024-02-13 ENCOUNTER — Other Ambulatory Visit (INDEPENDENT_AMBULATORY_CARE_PROVIDER_SITE_OTHER)

## 2024-02-13 DIAGNOSIS — I10 Essential (primary) hypertension: Secondary | ICD-10-CM

## 2024-02-13 DIAGNOSIS — E782 Mixed hyperlipidemia: Secondary | ICD-10-CM | POA: Diagnosis not present

## 2024-02-13 DIAGNOSIS — I2510 Atherosclerotic heart disease of native coronary artery without angina pectoris: Secondary | ICD-10-CM

## 2024-02-13 DIAGNOSIS — R0602 Shortness of breath: Secondary | ICD-10-CM | POA: Diagnosis not present

## 2024-02-13 DIAGNOSIS — I2583 Coronary atherosclerosis due to lipid rich plaque: Secondary | ICD-10-CM | POA: Diagnosis not present

## 2024-02-13 LAB — COMPREHENSIVE METABOLIC PANEL WITH GFR
ALT: 26 U/L (ref 3–53)
AST: 23 U/L (ref 5–37)
Albumin: 4.1 g/dL (ref 3.5–5.2)
Alkaline Phosphatase: 81 U/L (ref 39–117)
BUN: 17 mg/dL (ref 6–23)
CO2: 29 meq/L (ref 19–32)
Calcium: 9.4 mg/dL (ref 8.4–10.5)
Chloride: 102 meq/L (ref 96–112)
Creatinine, Ser: 1.03 mg/dL (ref 0.40–1.50)
GFR: 71.96 mL/min
Glucose, Bld: 124 mg/dL — ABNORMAL HIGH (ref 70–99)
Potassium: 4.1 meq/L (ref 3.5–5.1)
Sodium: 137 meq/L (ref 135–145)
Total Bilirubin: 0.8 mg/dL (ref 0.2–1.2)
Total Protein: 7.5 g/dL (ref 6.0–8.3)

## 2024-02-13 LAB — LIPID PANEL
Cholesterol: 163 mg/dL (ref 28–200)
HDL: 42 mg/dL
LDL Cholesterol: 86 mg/dL (ref 10–99)
NonHDL: 121.16
Total CHOL/HDL Ratio: 4
Triglycerides: 177 mg/dL — ABNORMAL HIGH (ref 10.0–149.0)
VLDL: 35.4 mg/dL (ref 0.0–40.0)

## 2024-02-13 LAB — CBC WITH DIFFERENTIAL/PLATELET
Basophils Absolute: 0.1 10*3/uL (ref 0.0–0.1)
Basophils Relative: 1.1 % (ref 0.0–3.0)
Eosinophils Absolute: 0.1 10*3/uL (ref 0.0–0.7)
Eosinophils Relative: 2.2 % (ref 0.0–5.0)
HCT: 48.4 % (ref 39.0–52.0)
Hemoglobin: 15.8 g/dL (ref 13.0–17.0)
Lymphocytes Relative: 32.7 % (ref 12.0–46.0)
Lymphs Abs: 2.2 10*3/uL (ref 0.7–4.0)
MCHC: 32.6 g/dL (ref 30.0–36.0)
MCV: 80.9 fl (ref 78.0–100.0)
Monocytes Absolute: 0.7 10*3/uL (ref 0.1–1.0)
Monocytes Relative: 10.6 % (ref 3.0–12.0)
Neutro Abs: 3.6 10*3/uL (ref 1.4–7.7)
Neutrophils Relative %: 53.4 % (ref 43.0–77.0)
Platelets: 194 10*3/uL (ref 150.0–400.0)
RBC: 5.99 Mil/uL — ABNORMAL HIGH (ref 4.22–5.81)
RDW: 15.3 % (ref 11.5–15.5)
WBC: 6.8 10*3/uL (ref 4.0–10.5)

## 2024-02-13 LAB — MICROALBUMIN / CREATININE URINE RATIO
Creatinine,U: 192.7 mg/dL
Microalb Creat Ratio: 66.4 mg/g — ABNORMAL HIGH (ref 0.0–30.0)
Microalb, Ur: 12.8 mg/dL — ABNORMAL HIGH (ref 0.7–1.9)

## 2024-02-13 LAB — BRAIN NATRIURETIC PEPTIDE: Pro B Natriuretic peptide (BNP): 26 pg/mL (ref 1.0–100.0)

## 2024-02-14 ENCOUNTER — Ambulatory Visit: Payer: Self-pay | Admitting: Family Medicine

## 2024-02-14 DIAGNOSIS — N182 Chronic kidney disease, stage 2 (mild): Secondary | ICD-10-CM | POA: Insufficient documentation

## 2024-02-14 LAB — TSH RFX ON ABNORMAL TO FREE T4: TSH: 2.23 u[IU]/mL (ref 0.450–4.500)

## 2024-02-15 LAB — URINALYSIS W MICROSCOPIC + REFLEX CULTURE
Bacteria, UA: NONE SEEN /HPF
Bilirubin Urine: NEGATIVE
Glucose, UA: NEGATIVE
Hgb urine dipstick: NEGATIVE
Ketones, ur: NEGATIVE
Nitrites, Initial: NEGATIVE
RBC / HPF: NONE SEEN /HPF (ref 0–2)
Specific Gravity, Urine: 1.019 (ref 1.001–1.035)
Squamous Epithelial / HPF: NONE SEEN /HPF
pH: <=5 — AB (ref 5.0–8.0)

## 2024-02-15 LAB — CULTURE INDICATED

## 2024-02-15 LAB — URINE CULTURE
MICRO NUMBER:: 17523733
Result:: NO GROWTH
SPECIMEN QUALITY:: ADEQUATE

## 2024-02-18 NOTE — Progress Notes (Signed)
Message sent to pt letting him know

## 2024-03-13 ENCOUNTER — Ambulatory Visit: Admitting: Family Medicine

## 2024-03-13 ENCOUNTER — Encounter: Admitting: Family Medicine
